# Patient Record
Sex: Male | Born: 1985 | Race: White | Hispanic: No | Marital: Married | Smoking: Never smoker
Health system: Southern US, Community
[De-identification: ages and names within clinical notes are randomized; demographics above are authoritative.]

## PROBLEM LIST (undated history)

## (undated) DIAGNOSIS — K625 Hemorrhage of anus and rectum: Secondary | ICD-10-CM

## (undated) DIAGNOSIS — F32A Depression, unspecified: Secondary | ICD-10-CM

## (undated) DIAGNOSIS — M545 Low back pain, unspecified: Secondary | ICD-10-CM

## (undated) DIAGNOSIS — G4730 Sleep apnea, unspecified: Secondary | ICD-10-CM

## (undated) DIAGNOSIS — M1990 Unspecified osteoarthritis, unspecified site: Secondary | ICD-10-CM

## (undated) DIAGNOSIS — K219 Gastro-esophageal reflux disease without esophagitis: Secondary | ICD-10-CM

## (undated) DIAGNOSIS — E669 Obesity, unspecified: Secondary | ICD-10-CM

## (undated) DIAGNOSIS — R062 Wheezing: Secondary | ICD-10-CM

## (undated) DIAGNOSIS — E7800 Pure hypercholesterolemia, unspecified: Secondary | ICD-10-CM

## (undated) HISTORY — DX: Low back pain, unspecified: M54.50

## (undated) HISTORY — PX: TONSILECTOMY, ADENOIDECTOMY, BILATERAL MYRINGOTOMY AND TUBES: SHX2538

## (undated) HISTORY — DX: Pure hypercholesterolemia, unspecified: E78.00

## (undated) HISTORY — DX: Hemorrhage of anus and rectum: K62.5

## (undated) HISTORY — DX: Wheezing: R06.2

## (undated) HISTORY — PX: OTHER SURGICAL HISTORY: SHX169

## (undated) HISTORY — DX: Depression, unspecified: F32.A

## (undated) HISTORY — DX: Obesity, unspecified: E66.9

## (undated) HISTORY — PX: KNEE SURGERY: SHX244

---
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
## 2010-11-03 HISTORY — PX: KNEE SURGERY: SHX244

## 2011-03-27 ENCOUNTER — Emergency Department: Payer: Self-pay | Admitting: Emergency Medicine

## 2012-03-14 DIAGNOSIS — S83289A Other tear of lateral meniscus, current injury, unspecified knee, initial encounter: Secondary | ICD-10-CM | POA: Insufficient documentation

## 2012-03-25 DIAGNOSIS — Z9889 Other specified postprocedural states: Secondary | ICD-10-CM | POA: Insufficient documentation

## 2012-04-26 DIAGNOSIS — Z9889 Other specified postprocedural states: Secondary | ICD-10-CM | POA: Insufficient documentation

## 2017-07-28 ENCOUNTER — Encounter: Payer: Self-pay | Admitting: Emergency Medicine

## 2017-07-28 ENCOUNTER — Inpatient Hospital Stay
Admission: EM | Admit: 2017-07-28 | Discharge: 2017-07-30 | DRG: 494 | Disposition: A | Discharge status: Home / Self Care | Payer: BLUE CROSS/BLUE SHIELD | Attending: Surgery | Admitting: Surgery

## 2017-07-28 ENCOUNTER — Emergency Department: Payer: BLUE CROSS/BLUE SHIELD

## 2017-07-28 DIAGNOSIS — Z6836 Body mass index (BMI) 36.0-36.9, adult: Secondary | ICD-10-CM

## 2017-07-28 DIAGNOSIS — Y9364 Activity, baseball: Secondary | ICD-10-CM

## 2017-07-28 DIAGNOSIS — S9305XA Dislocation of left ankle joint, initial encounter: Secondary | ICD-10-CM

## 2017-07-28 DIAGNOSIS — S82832A Other fracture of upper and lower end of left fibula, initial encounter for closed fracture: Principal | ICD-10-CM | POA: Diagnosis present

## 2017-07-28 DIAGNOSIS — Z419 Encounter for procedure for purposes other than remedying health state, unspecified: Secondary | ICD-10-CM

## 2017-07-28 DIAGNOSIS — S82899A Other fracture of unspecified lower leg, initial encounter for closed fracture: Secondary | ICD-10-CM | POA: Diagnosis present

## 2017-07-28 LAB — CBC WITH DIFFERENTIAL/PLATELET
BASOS ABS: 0.1 10*3/uL (ref 0–0.1)
Basophils Relative: 1 %
EOS ABS: 0.2 10*3/uL (ref 0–0.7)
EOS PCT: 2 %
HEMATOCRIT: 43.4 % (ref 40.0–52.0)
Hemoglobin: 14.7 g/dL (ref 13.0–18.0)
LYMPHS ABS: 1.2 10*3/uL (ref 1.0–3.6)
Lymphocytes Relative: 9 %
MCH: 28.2 pg (ref 26.0–34.0)
MCHC: 34 g/dL (ref 32.0–36.0)
MCV: 82.9 fL (ref 80.0–100.0)
Monocytes Absolute: 0.9 10*3/uL (ref 0.2–1.0)
Monocytes Relative: 7 %
Neutro Abs: 10.9 10*3/uL — ABNORMAL HIGH (ref 1.4–6.5)
Neutrophils Relative %: 81 %
PLATELETS: 193 10*3/uL (ref 150–440)
RBC: 5.23 MIL/uL (ref 4.40–5.90)
RDW: 13.6 % (ref 11.5–14.5)
WBC: 13.3 10*3/uL — AB (ref 3.8–10.6)

## 2017-07-28 LAB — COMPREHENSIVE METABOLIC PANEL
ALK PHOS: 54 U/L (ref 38–126)
ALT: 48 U/L (ref 17–63)
AST: 26 U/L (ref 15–41)
Albumin: 4.6 g/dL (ref 3.5–5.0)
Anion gap: 11 (ref 5–15)
BUN: 23 mg/dL — AB (ref 6–20)
CO2: 27 mmol/L (ref 22–32)
CREATININE: 1.14 mg/dL (ref 0.61–1.24)
Calcium: 9.4 mg/dL (ref 8.9–10.3)
Chloride: 103 mmol/L (ref 101–111)
GFR calc Af Amer: >60 mL/min (ref >60)
Glucose, Bld: 107 mg/dL — ABNORMAL HIGH (ref 65–99)
Potassium: 4 mmol/L (ref 3.5–5.1)
Sodium: 141 mmol/L (ref 135–145)
TOTAL PROTEIN: 7.4 g/dL (ref 6.5–8.1)
Total Bilirubin: 0.8 mg/dL (ref 0.3–1.2)

## 2017-07-28 MED ORDER — HYDROMORPHONE HCL 1 MG/ML IJ SOLN
INTRAMUSCULAR | Status: AC
  Filled 2017-07-28: qty 1

## 2017-07-28 MED ORDER — KCL IN DEXTROSE-NACL 20-5-0.9 MEQ/L-%-% IV SOLN
INTRAVENOUS | Status: DC
  Administered 2017-07-28 – 2017-07-29 (×2): via INTRAVENOUS
  Filled 2017-07-28 (×5): qty 1000

## 2017-07-28 MED ORDER — DEXTROSE 5 % IV SOLN
3.0000 g | Freq: Once | INTRAVENOUS | Status: DC
  Filled 2017-07-28 (×2): qty 3000

## 2017-07-28 MED ORDER — HYDROMORPHONE HCL 1 MG/ML IJ SOLN
1.0000 mg | Freq: Once | INTRAMUSCULAR | Status: AC
  Administered 2017-07-28: 1 mg via INTRAVENOUS

## 2017-07-28 MED ORDER — INFLUENZA VAC SPLIT QUAD 0.5 ML IM SUSY: 0.5000 mL | PREFILLED_SYRINGE | INTRAMUSCULAR | Status: DC

## 2017-07-28 MED ORDER — ACETAMINOPHEN 650 MG RE SUPP: 650.0000 mg | Freq: Four times a day (QID) | RECTAL | Status: DC | PRN

## 2017-07-28 MED ORDER — ACETAMINOPHEN 325 MG PO TABS: 650.0000 mg | ORAL_TABLET | Freq: Four times a day (QID) | ORAL | Status: DC | PRN

## 2017-07-28 MED ORDER — OXYCODONE HCL 5 MG PO TABS
5.0000 mg | ORAL_TABLET | ORAL | Status: DC | PRN
  Administered 2017-07-28 – 2017-07-29 (×2): 10 mg via ORAL
  Filled 2017-07-28 (×2): qty 2

## 2017-07-28 MED ORDER — MAGNESIUM HYDROXIDE 400 MG/5ML PO SUSP: 30.0000 mL | Freq: Every day | ORAL | Status: DC | PRN

## 2017-07-28 MED ORDER — FENTANYL CITRATE (PF) 100 MCG/2ML IJ SOLN
100.0000 ug | Freq: Once | INTRAMUSCULAR | Status: AC
  Administered 2017-07-28: 100 ug via INTRAVENOUS
  Filled 2017-07-28: qty 2

## 2017-07-28 MED ORDER — FLEET ENEMA 7-19 GM/118ML RE ENEM: 1.0000 | ENEMA | Freq: Once | RECTAL | Status: DC | PRN

## 2017-07-28 MED ORDER — PANTOPRAZOLE SODIUM 40 MG IV SOLR: 40.0000 mg | Freq: Every day | INTRAVENOUS | Status: DC

## 2017-07-28 MED ORDER — DOCUSATE SODIUM 100 MG PO CAPS: 100.0000 mg | ORAL_CAPSULE | Freq: Two times a day (BID) | ORAL | Status: DC

## 2017-07-28 MED ORDER — BISACODYL 10 MG RE SUPP: 10.0000 mg | Freq: Every day | RECTAL | Status: DC | PRN

## 2017-07-28 MED ORDER — ACETAMINOPHEN 325 MG PO TABS
650.0000 mg | ORAL_TABLET | Freq: Once | ORAL | Status: AC
  Administered 2017-07-28: 650 mg via ORAL
  Filled 2017-07-28: qty 2

## 2017-07-28 MED ORDER — ONDANSETRON HCL 4 MG/2ML IJ SOLN
4.0000 mg | Freq: Once | INTRAMUSCULAR | Status: AC
  Administered 2017-07-28: 4 mg via INTRAVENOUS
  Filled 2017-07-28: qty 2

## 2017-07-28 MED ORDER — HYDROMORPHONE HCL 1 MG/ML IJ SOLN
1.0000 mg | INTRAMUSCULAR | Status: DC | PRN
  Administered 2017-07-29 (×3): 1 mg via INTRAVENOUS
  Filled 2017-07-28 (×3): qty 1

## 2017-07-28 NOTE — ED Notes (Signed)
Pt was playing softball when he twisted left ankle sliding into base. Positive swelling noted to left ankle with inability to bear weight. Pt has normal pulses and circ wnl.

## 2017-07-28 NOTE — ED Triage Notes (Addendum)
Pt assisted out of vehicle onto stretcher with no distress noted; reports PTA injured left ankle while playing softball, sliding into base; denies any other c/o or injuries at present; st unable to weight bear; +PP, brisk cap refill, W&D, good movement & sensation

## 2017-07-28 NOTE — ED Notes (Signed)
Ortho in to eval

## 2017-07-28 NOTE — ED Provider Notes (Signed)
[facility] Emergency Department Provider Note  ____________________________________________  Time seen: Approximately 8:37 PM  I have reviewed the triage vital signs and the nursing notes.   HISTORY  Chief Complaint Ankle Pain    HPI Adrian Lang is a 31 y.o. male who presents emergency department complaining of sharp left ankle pain. Patient reports it is planned softball when he injured his ankle sliding into base. Patient reports that his left ankle was tucked underneath him and he arises that his toe became caught in the dirt, rotating his ankle laterally. Patient heard and felt an audible snap. Patient had extreme immediate pain and is nonweightbearing. Limited range of motion to the ankle joint. No numbness and tingling in the toe. Patient denies any other injury or complaint. No history of previous ankle fractures.   History reviewed. No pertinent past medical history.  Patient Active Problem List   Diagnosis Date Noted  . Ankle fracture 07/28/2017    Past Surgical History:  Procedure Laterality Date  . KNEE SURGERY Right     Prior to Admission medications   Not on File    Allergies Patient has no known allergies.  No family history on file.  Social History Social History  Substance Use Topics  . Smoking status: Never Smoker  . Smokeless tobacco: Never Used  . Alcohol use No     Review of Systems  Constitutional: No fever/chills Eyes: No visual changes. No discharge ENT: No upper respiratory complaints. Cardiovascular: no chest pain. Respiratory: no cough. No SOB. Gastrointestinal: No abdominal pain.  No nausea, no vomiting.   Musculoskeletal: Positive for pain, swelling, injury to the left ankle. Skin: Negative for rash, abrasions, lacerations, ecchymosis. Neurological: Negative for headaches, focal weakness or numbness. 10-point ROS otherwise negative.  ____________________________________________   PHYSICAL  EXAM:  VITAL SIGNS: ED Triage Vitals  Enc Vitals Group     BP 07/28/17 1951 (!) 147/87     Pulse Rate 07/28/17 1951 88     Resp 07/28/17 1951 18     Temp 07/28/17 1951 97.7 F (36.5 C)     Temp Source 07/28/17 1951 Oral     SpO2 07/28/17 1951 97 %     Weight 07/28/17 1950 252 lb (114.3 kg)     Height 07/28/17 1950 6' (1.829 m)     Head Circumference --      Peak Flow --      Pain Score 07/28/17 1949 10     Pain Loc --      Pain Edu? --      Excl. in GC? --      Constitutional: Alert and oriented. Well appearing and in no acute distress. Eyes: Conjunctivae are normal. PERRL. EOMI. Head: Atraumatic. Neck: No stridor.    Cardiovascular: Normal rate, regular rhythm. Normal S1 and S2.  Good peripheral circulation. Respiratory: Normal respiratory effort without tachypnea or retractions. Lungs CTAB. Good air entry to the bases with no decreased or absent breath sounds. Musculoskeletal: No gross deformity to left ankle upon inspection. Edema noted both medial and lateral aspects of the ankle. Limited range of motion. No stress testing of the ankle is performed at this time. Dorsalis pedis pulse intact. Sensation intact all 5 digits. Patient has tenderness to palpation over the distal fibula as well as medial malleolus. No definitive palpable abnormality.. Neurologic:  Normal speech and language. No gross focal neurologic deficits are appreciated.  Skin:  Skin is warm, dry and intact. No rash noted. Psychiatric: Mood and  affect are normal. Speech and behavior are normal. Patient exhibits appropriate insight and judgement.   ____________________________________________   LABS (all labs ordered are listed, but only abnormal results are displayed)  Labs Reviewed  COMPREHENSIVE METABOLIC PANEL - Abnormal; Notable for the following:       Result Value   Glucose, Bld 107 (*)    BUN 23 (*)    All other components within normal limits  CBC WITH DIFFERENTIAL/PLATELET - Abnormal; Notable  for the following:    WBC 13.3 (*)    Neutro Abs 10.9 (*)    All other components within normal limits   ____________________________________________  EKG   ____________________________________________  RADIOLOGY Festus Barren Rual Vermeer, personally viewed and evaluated these images (plain radiographs) as part of my medical decision making, as well as reviewing the written report by the radiologist.  Dg Chest 1 View  Result Date: 07/28/2017 CLINICAL DATA:  Preoperative chest x-ray. EXAM: CHEST 1 VIEW COMPARISON:  None. FINDINGS: The heart size and mediastinal contours are within normal limits. Both lungs are clear. The visualized skeletal structures are unremarkable. IMPRESSION: No active cardiopulmonary disease. Electronically Signed   By: Sherian Rein M.D.   On: 07/28/2017 21:31   Dg Ankle Complete Left  Result Date: 07/28/2017 CLINICAL DATA:  Status post trauma today with pain of left ankle. EXAM: LEFT ANKLE COMPLETE - 3+ VIEW COMPARISON:  None. FINDINGS: There is comminuted displaced fracture of the distal fibular shaft. There is disruption of the ankle mortise with medial displacement of the tibia relative to the talus. IMPRESSION: Fracture of distal fibula. Disruption of ankle mortise as described. Electronically Signed   By: Sherian Rein M.D.   On: 07/28/2017 20:18    ____________________________________________    PROCEDURES  Procedure(s) performed:    .Splint Application Date/Time: 07/28/2017 9:27 PM Performed by: Gala Romney D Authorized by: Gala Romney D   Consent:    Consent obtained:  Verbal   Consent given by:  Patient   Risks discussed:  Pain and swelling Pre-procedure details:    Sensation:  Normal Procedure details:    Laterality:  Left   Location:  Ankle   Ankle:  L ankle   Splint type:  Short leg and ankle stirrup   Supplies:  Cotton padding, elastic bandage and Ortho-Glass Post-procedure details:    Pain:  Improved   Sensation:   Normal   Patient tolerance of procedure:  Tolerated well, no immediate complications      Medications  dextrose 5 % and 0.9 % NaCl with KCl 20 mEq/L infusion (not administered)  acetaminophen (TYLENOL) tablet 650 mg (not administered)    Or  acetaminophen (TYLENOL) suppository 650 mg (not administered)  HYDROmorphone (DILAUDID) injection 1-2 mg (not administered)  docusate sodium (COLACE) capsule 100 mg (not administered)  magnesium hydroxide (MILK OF MAGNESIA) suspension 30 mL (not administered)  bisacodyl (DULCOLAX) suppository 10 mg (not administered)  sodium phosphate (FLEET) 7-19 GM/118ML enema 1 enema (not administered)  pantoprazole (PROTONIX) injection 40 mg (not administered)  ceFAZolin (ANCEF) 3 g in dextrose 5 % 50 mL IVPB (not administered)  oxyCODONE (Oxy IR/ROXICODONE) immediate release tablet 5-10 mg (not administered)  HYDROmorphone (DILAUDID) 1 MG/ML injection (not administered)  Influenza vac split quadrivalent PF (FLUARIX) injection 0.5 mL (not administered)  acetaminophen (TYLENOL) tablet 650 mg (650 mg Oral Given 07/28/17 2055)  ondansetron (ZOFRAN) injection 4 mg (4 mg Intravenous Given 07/28/17 2114)  fentaNYL (SUBLIMAZE) injection 100 mcg (100 mcg Intravenous Given 07/28/17 2116)  HYDROmorphone (DILAUDID)  injection 1 mg (1 mg Intravenous Given 07/28/17 2155)     ____________________________________________   INITIAL IMPRESSION / ASSESSMENT AND PLAN / ED COURSE  Pertinent labs & imaging results that were available during my care of the patient were reviewed by me and considered in my medical decision making (see chart for details).  Review of the [place] CSRS was performed in accordance of the [facility] prior to dispensing any controlled drugs.     Patient's diagnosis is consistent with left distal fibula fracture with separation of the ankle mortise consistent with ligamentous rupture of the ankle. Patient was neurovascularly intact. Initially patient declined pain  medication. I discussed the case with on-call orthopedic surgeon, Dr. Joice Lofts who recommends admission overnight for pain control, immobilization, surgical fixation in the morning..      ____________________________________________  FINAL CLINICAL IMPRESSION(S) / ED DIAGNOSES  Final diagnoses:  Other closed fracture of distal end of left fibula, initial encounter  Dislocation of left ankle joint, initial encounter      NEW MEDICATIONS STARTED DURING THIS VISIT:  There are no discharge medications for this patient.       This chart was dictated using voice recognition software/Dragon. Despite best efforts to proofread, errors can occur which can change the meaning. Any change was purely unintentional.    Racheal Patches, PA-C 07/28/17 2300    Dionne Bucy, MD 07/29/17 0020

## 2017-07-28 NOTE — ED Notes (Signed)
Report given to [facility]. Patient in stable condition prior. Patient care transferred to 138

## 2017-07-29 ENCOUNTER — Inpatient Hospital Stay: Payer: BLUE CROSS/BLUE SHIELD

## 2017-07-29 ENCOUNTER — Encounter: Payer: Self-pay | Admitting: *Deleted

## 2017-07-29 ENCOUNTER — Encounter
Admission: EM | Disposition: A | Discharge status: Home / Self Care | Payer: Self-pay | Source: Home / Self Care | Attending: Surgery

## 2017-07-29 ENCOUNTER — Inpatient Hospital Stay: Payer: BLUE CROSS/BLUE SHIELD | Admitting: Anesthesiology

## 2017-07-29 HISTORY — PX: ORIF ANKLE FRACTURE: SHX5408

## 2017-07-29 LAB — SURGICAL PCR SCREEN
MRSA, PCR: NEGATIVE
Staphylococcus aureus: POSITIVE — AB

## 2017-07-29 SURGERY — OPEN REDUCTION INTERNAL FIXATION (ORIF) ANKLE FRACTURE
Anesthesia: General | Site: Ankle | Laterality: Left | Wound class: Clean

## 2017-07-29 MED ORDER — GLYCOPYRROLATE 0.2 MG/ML IJ SOLN
INTRAMUSCULAR | Status: DC | PRN
Start: 2017-07-29 — End: 2017-07-29
  Administered 2017-07-29: 0.2 mg via INTRAVENOUS

## 2017-07-29 MED ORDER — FENTANYL CITRATE (PF) 100 MCG/2ML IJ SOLN
INTRAMUSCULAR | Status: AC
  Administered 2017-07-29: 25 ug via INTRAVENOUS
  Filled 2017-07-29: qty 2

## 2017-07-29 MED ORDER — KETOROLAC TROMETHAMINE 15 MG/ML IJ SOLN
30.0000 mg | Freq: Once | INTRAMUSCULAR | Status: DC
  Filled 2017-07-29: qty 2

## 2017-07-29 MED ORDER — METOCLOPRAMIDE HCL 10 MG PO TABS: 5.0000 mg | ORAL_TABLET | Freq: Three times a day (TID) | ORAL | Status: DC | PRN

## 2017-07-29 MED ORDER — PROPOFOL 10 MG/ML IV BOLUS
INTRAVENOUS | Status: AC
  Filled 2017-07-29: qty 20

## 2017-07-29 MED ORDER — MIDAZOLAM HCL 2 MG/2ML IJ SOLN
INTRAMUSCULAR | Status: DC | PRN
  Administered 2017-07-29: 2 mg via INTRAVENOUS

## 2017-07-29 MED ORDER — ACETAMINOPHEN 10 MG/ML IV SOLN
INTRAVENOUS | Status: AC
  Filled 2017-07-29: qty 100

## 2017-07-29 MED ORDER — LACTATED RINGERS IV SOLN
INTRAVENOUS | Status: DC | PRN
  Administered 2017-07-29: 14:00:00 via INTRAVENOUS

## 2017-07-29 MED ORDER — HYDROMORPHONE HCL 1 MG/ML IJ SOLN
0.5000 mg | INTRAMUSCULAR | Status: DC | PRN
  Administered 2017-07-29 (×2): 0.5 mg via INTRAVENOUS

## 2017-07-29 MED ORDER — OXYCODONE HCL 5 MG PO TABS
5.0000 mg | ORAL_TABLET | ORAL | Status: DC | PRN
  Administered 2017-07-29 – 2017-07-30 (×2): 10 mg via ORAL
  Filled 2017-07-29 (×2): qty 2

## 2017-07-29 MED ORDER — MIDAZOLAM HCL 2 MG/2ML IJ SOLN
INTRAMUSCULAR | Status: AC
  Filled 2017-07-29: qty 2

## 2017-07-29 MED ORDER — HYDROMORPHONE HCL 1 MG/ML IJ SOLN
INTRAMUSCULAR | Status: AC
  Administered 2017-07-29: 0.5 mg via INTRAVENOUS
  Filled 2017-07-29: qty 1

## 2017-07-29 MED ORDER — KETOROLAC TROMETHAMINE 30 MG/ML IJ SOLN
INTRAMUSCULAR | Status: AC
  Administered 2017-07-29: 30 mg via INTRAVENOUS
  Filled 2017-07-29: qty 1

## 2017-07-29 MED ORDER — FENTANYL CITRATE (PF) 100 MCG/2ML IJ SOLN
25.0000 ug | INTRAMUSCULAR | Status: DC | PRN
  Administered 2017-07-29 (×4): 25 ug via INTRAVENOUS

## 2017-07-29 MED ORDER — FLEET ENEMA 7-19 GM/118ML RE ENEM: 1.0000 | ENEMA | Freq: Once | RECTAL | Status: DC | PRN

## 2017-07-29 MED ORDER — ENOXAPARIN SODIUM 40 MG/0.4ML ~~LOC~~ SOLN: 40.0000 mg | SUBCUTANEOUS | Status: DC

## 2017-07-29 MED ORDER — LIDOCAINE HCL (CARDIAC) 20 MG/ML IV SOLN
INTRAVENOUS | Status: DC | PRN
  Administered 2017-07-29: 100 mg via INTRAVENOUS

## 2017-07-29 MED ORDER — BISACODYL 10 MG RE SUPP: 10.0000 mg | Freq: Every day | RECTAL | Status: DC | PRN

## 2017-07-29 MED ORDER — METOCLOPRAMIDE HCL 5 MG/ML IJ SOLN: 5.0000 mg | Freq: Three times a day (TID) | INTRAMUSCULAR | Status: DC | PRN

## 2017-07-29 MED ORDER — DEXAMETHASONE SODIUM PHOSPHATE 10 MG/ML IJ SOLN
INTRAMUSCULAR | Status: DC | PRN
  Administered 2017-07-29: 10 mg via INTRAVENOUS

## 2017-07-29 MED ORDER — ONDANSETRON HCL 4 MG PO TABS: 4.0000 mg | ORAL_TABLET | Freq: Four times a day (QID) | ORAL | Status: DC | PRN

## 2017-07-29 MED ORDER — ONDANSETRON HCL 4 MG/2ML IJ SOLN
INTRAMUSCULAR | Status: AC
  Filled 2017-07-29: qty 2

## 2017-07-29 MED ORDER — KETOROLAC TROMETHAMINE 15 MG/ML IJ SOLN
15.0000 mg | Freq: Four times a day (QID) | INTRAMUSCULAR | Status: DC
  Administered 2017-07-29 – 2017-07-30 (×3): 15 mg via INTRAVENOUS
  Filled 2017-07-29 (×3): qty 1

## 2017-07-29 MED ORDER — ACETAMINOPHEN 325 MG PO TABS: 650.0000 mg | ORAL_TABLET | Freq: Four times a day (QID) | ORAL | Status: DC | PRN

## 2017-07-29 MED ORDER — FENTANYL CITRATE (PF) 100 MCG/2ML IJ SOLN
INTRAMUSCULAR | Status: AC
  Filled 2017-07-29: qty 4

## 2017-07-29 MED ORDER — BUPIVACAINE HCL (PF) 0.5 % IJ SOLN
INTRAMUSCULAR | Status: AC
  Filled 2017-07-29: qty 30

## 2017-07-29 MED ORDER — ONDANSETRON HCL 4 MG/2ML IJ SOLN
4.0000 mg | Freq: Four times a day (QID) | INTRAMUSCULAR | Status: DC | PRN
  Administered 2017-07-29: 4 mg via INTRAVENOUS
  Filled 2017-07-29: qty 2

## 2017-07-29 MED ORDER — ACETAMINOPHEN 650 MG RE SUPP: 650.0000 mg | Freq: Four times a day (QID) | RECTAL | Status: DC | PRN

## 2017-07-29 MED ORDER — ONDANSETRON HCL 4 MG/2ML IJ SOLN: 4.0000 mg | Freq: Four times a day (QID) | INTRAMUSCULAR | Status: DC | PRN

## 2017-07-29 MED ORDER — DEXAMETHASONE SODIUM PHOSPHATE 10 MG/ML IJ SOLN
INTRAMUSCULAR | Status: AC
  Filled 2017-07-29: qty 1

## 2017-07-29 MED ORDER — DEXTROSE 5 % IV SOLN
3.0000 g | Freq: Four times a day (QID) | INTRAVENOUS | Status: AC
  Administered 2017-07-29 – 2017-07-30 (×3): 3 g via INTRAVENOUS
  Filled 2017-07-29 (×3): qty 3000

## 2017-07-29 MED ORDER — MAGNESIUM HYDROXIDE 400 MG/5ML PO SUSP: 30.0000 mL | Freq: Every day | ORAL | Status: DC | PRN

## 2017-07-29 MED ORDER — GLYCOPYRROLATE 0.2 MG/ML IJ SOLN
INTRAMUSCULAR | Status: AC
  Filled 2017-07-29: qty 1

## 2017-07-29 MED ORDER — KETOROLAC TROMETHAMINE 30 MG/ML IJ SOLN
30.0000 mg | Freq: Once | INTRAMUSCULAR | Status: AC
  Administered 2017-07-29: 30 mg via INTRAVENOUS

## 2017-07-29 MED ORDER — DOCUSATE SODIUM 100 MG PO CAPS
100.0000 mg | ORAL_CAPSULE | Freq: Two times a day (BID) | ORAL | Status: DC
  Administered 2017-07-29 – 2017-07-30 (×2): 100 mg via ORAL
  Filled 2017-07-29 (×2): qty 1

## 2017-07-29 MED ORDER — PANTOPRAZOLE SODIUM 40 MG PO TBEC
40.0000 mg | DELAYED_RELEASE_TABLET | Freq: Every day | ORAL | Status: DC
  Administered 2017-07-29 – 2017-07-30 (×2): 40 mg via ORAL
  Filled 2017-07-29 (×2): qty 1

## 2017-07-29 MED ORDER — DEXTROSE 5 % IV SOLN
INTRAVENOUS | Status: DC | PRN
  Administered 2017-07-29: 3 g via INTRAVENOUS

## 2017-07-29 MED ORDER — SEVOFLURANE IN SOLN
RESPIRATORY_TRACT | Status: AC
  Filled 2017-07-29: qty 250

## 2017-07-29 MED ORDER — ACETAMINOPHEN 500 MG PO TABS
1000.0000 mg | ORAL_TABLET | Freq: Four times a day (QID) | ORAL | Status: DC
  Administered 2017-07-29 – 2017-07-30 (×3): 1000 mg via ORAL
  Filled 2017-07-29 (×3): qty 2

## 2017-07-29 MED ORDER — ONDANSETRON HCL 4 MG/2ML IJ SOLN
INTRAMUSCULAR | Status: DC | PRN
  Administered 2017-07-29: 4 mg via INTRAVENOUS

## 2017-07-29 MED ORDER — NEOMYCIN-POLYMYXIN B GU 40-200000 IR SOLN
Status: DC | PRN
  Administered 2017-07-29: 4 mL

## 2017-07-29 MED ORDER — PROPOFOL 10 MG/ML IV BOLUS
INTRAVENOUS | Status: DC | PRN
  Administered 2017-07-29: 200 mg via INTRAVENOUS

## 2017-07-29 MED ORDER — DIPHENHYDRAMINE HCL 12.5 MG/5ML PO ELIX: 12.5000 mg | ORAL_SOLUTION | ORAL | Status: DC | PRN

## 2017-07-29 MED ORDER — NEOMYCIN-POLYMYXIN B GU 40-200000 IR SOLN
Status: AC
  Filled 2017-07-29: qty 4

## 2017-07-29 MED ORDER — LIDOCAINE HCL (PF) 2 % IJ SOLN
INTRAMUSCULAR | Status: AC
  Filled 2017-07-29: qty 6

## 2017-07-29 MED ORDER — BUPIVACAINE HCL 0.5 % IJ SOLN
INTRAMUSCULAR | Status: DC | PRN
  Administered 2017-07-29: 15 mL

## 2017-07-29 MED ORDER — ACETAMINOPHEN 10 MG/ML IV SOLN
INTRAVENOUS | Status: DC | PRN
  Administered 2017-07-29: 1000 mg via INTRAVENOUS

## 2017-07-29 MED ORDER — ONDANSETRON HCL 4 MG/2ML IJ SOLN: 4.0000 mg | Freq: Once | INTRAMUSCULAR | Status: DC | PRN

## 2017-07-29 MED ORDER — FENTANYL CITRATE (PF) 100 MCG/2ML IJ SOLN
INTRAMUSCULAR | Status: DC | PRN
  Administered 2017-07-29 (×2): 50 ug via INTRAVENOUS
  Administered 2017-07-29: 100 ug via INTRAVENOUS

## 2017-07-29 MED ORDER — KCL IN DEXTROSE-NACL 20-5-0.9 MEQ/L-%-% IV SOLN
INTRAVENOUS | Status: DC
  Administered 2017-07-29 – 2017-07-30 (×2): via INTRAVENOUS
  Filled 2017-07-29 (×3): qty 1000

## 2017-07-29 SURGICAL SUPPLY — 52 items
BANDAGE ACE 4X5 VEL STRL LF (GAUZE/BANDAGES/DRESSINGS) ×6 IMPLANT
BANDAGE ACE 6X5 VEL STRL LF (GAUZE/BANDAGES/DRESSINGS) ×3 IMPLANT
BIT DRILL 2.5X2.75 QC CALB (BIT) ×3 IMPLANT
BIT DRILL 3.5X5.5 QC CALB (BIT) ×3 IMPLANT
BLADE SURG SZ10 CARB STEEL (BLADE) ×6 IMPLANT
BNDG COHESIVE 4X5 TAN STRL (GAUZE/BANDAGES/DRESSINGS) ×3 IMPLANT
BNDG ESMARK 6X12 TAN STRL LF (GAUZE/BANDAGES/DRESSINGS) ×3 IMPLANT
BNDG PLASTER FAST 4X5 WHT LF (CAST SUPPLIES) ×12 IMPLANT
CANISTER SUCT 1200ML W/VALVE (MISCELLANEOUS) ×3 IMPLANT
CHLORAPREP W/TINT 26ML (MISCELLANEOUS) ×6 IMPLANT
CUFF TOURN 24 STER (MISCELLANEOUS) IMPLANT
CUFF TOURN 30 STER DUAL PORT (MISCELLANEOUS) IMPLANT
DRAPE C-ARM XRAY 36X54 (DRAPES) ×3 IMPLANT
DRAPE C-ARMOR (DRAPES) ×3 IMPLANT
DRAPE INCISE IOBAN 66X45 STRL (DRAPES) ×3 IMPLANT
DRAPE U-SHAPE 47X51 STRL (DRAPES) ×3 IMPLANT
ELECT CAUTERY BLADE 6.4 (BLADE) ×3 IMPLANT
ELECT REM PT RETURN 9FT ADLT (ELECTROSURGICAL) ×3
ELECTRODE REM PT RTRN 9FT ADLT (ELECTROSURGICAL) ×1 IMPLANT
FIXATION ZIPTIGHT ANKLE SNDSMS (Ankle) ×1 IMPLANT
GAUZE PETRO XEROFOAM 1X8 (MISCELLANEOUS) ×3 IMPLANT
GAUZE SPONGE 4X4 12PLY STRL (GAUZE/BANDAGES/DRESSINGS) ×3 IMPLANT
GLOVE BIO SURGEON STRL SZ8 (GLOVE) ×6 IMPLANT
GLOVE INDICATOR 8.0 STRL GRN (GLOVE) ×3 IMPLANT
GOWN STRL REUS W/ TWL LRG LVL3 (GOWN DISPOSABLE) ×1 IMPLANT
GOWN STRL REUS W/ TWL XL LVL3 (GOWN DISPOSABLE) ×1 IMPLANT
GOWN STRL REUS W/TWL LRG LVL3 (GOWN DISPOSABLE) ×2
GOWN STRL REUS W/TWL XL LVL3 (GOWN DISPOSABLE) ×2
HEMOVAC 400ML (MISCELLANEOUS) ×3
KIT DRAIN HEMOVAC JP 7FR 400ML (MISCELLANEOUS) ×1 IMPLANT
KIT RM TURNOVER STRD PROC AR (KITS) ×3 IMPLANT
LABEL OR SOLS (LABEL) ×3 IMPLANT
NS IRRIG 1000ML POUR BTL (IV SOLUTION) ×3 IMPLANT
PACK EXTREMITY ARMC (MISCELLANEOUS) ×3 IMPLANT
PAD ABD DERMACEA PRESS 5X9 (GAUZE/BANDAGES/DRESSINGS) ×6 IMPLANT
PAD CAST CTTN 4X4 STRL (SOFTGOODS) ×2 IMPLANT
PAD PREP 24X41 OB/GYN DISP (PERSONAL CARE ITEMS) ×3 IMPLANT
PADDING CAST COTTON 4X4 STRL (SOFTGOODS) ×4
PLATE LOCK 8H 103 BILAT FIB (Plate) ×3 IMPLANT
SCREW CORTICAL 3.5MM 22MM (Screw) ×3 IMPLANT
SCREW LOCK CORT STAR 3.5X14 (Screw) ×3 IMPLANT
SCREW LOCK CORT STAR 3.5X18 (Screw) ×6 IMPLANT
SCREW NON LOCKING LP 3.5 14MM (Screw) ×3 IMPLANT
SCREW NON LOCKING LP 3.5 16MM (Screw) ×6 IMPLANT
SPONGE LAP 18X18 5 PK (GAUZE/BANDAGES/DRESSINGS) ×3 IMPLANT
STAPLER SKIN PROX 35W (STAPLE) ×3 IMPLANT
STOCKINETTE IMPERV 14X48 (MISCELLANEOUS) ×3 IMPLANT
SUT VIC AB 0 CT1 36 (SUTURE) ×3 IMPLANT
SUT VIC AB 2-0 SH 27 (SUTURE) ×4
SUT VIC AB 2-0 SH 27XBRD (SUTURE) ×2 IMPLANT
SYRINGE 10CC LL (SYRINGE) ×3 IMPLANT
ZIPTIGHT ANKLE SYNODESMOSS FIX (Ankle) ×3 IMPLANT

## 2017-07-29 NOTE — Care Management Note (Addendum)
Case Management Note  Patient Details  Name: Adrian Lang MRN: [id number] Date of Birth: 11/29/1985  Subjective/Objective:                  Met with patient prior to surgery for ankle fracture to discuss discharge planning. He is planning to return home at discharge with his wife. He is baseline independent at home; was playing ball when injury occurred. He has no DME. He uses [facility] on [address] for Rx.  Action/Plan: Home health list provided. RNCM will follow for DME.   Expected Discharge Date:  07/30/17               Expected Discharge Plan:     In-House Referral:     Discharge planning Services  CM Consult  Post Acute Care Choice:  Durable Medical Equipment, Home Health Choice offered to:  Patient  DME Arranged:    DME Agency:     HH Arranged:    HH Agency:     Status of Service:  In process, will continue to follow  If discussed at Long Length of Stay Meetings, dates discussed:    Additional Comments:  Angela Johnson, RN 07/29/2017, 10:19 AM  

## 2017-07-29 NOTE — Progress Notes (Signed)
Patient updated on plan and given PRN nausea medication. States that he was able to urinate a small amount, approx and is not having any pain at this time. Patient would like to hold off on I and O cath at this time. Will continue to monitor

## 2017-07-29 NOTE — Progress Notes (Signed)
Spoke with Dr. Ernest Pine, regarding patient status- nausea and difficulty urinating.   Med order given for I and O Cath PRN time one and for nausea medication.   Per MD, check with patient prior to I and O and cath if patient symptomatic.

## 2017-07-29 NOTE — Progress Notes (Signed)
Checked with patient, denies any bladder/pelvic pain and refusing I and O cath at this time. States he will wait until surgery. Pre-op called to check on wait time and PRN pain medication administered for comfort.

## 2017-07-29 NOTE — Anesthesia Postprocedure Evaluation (Signed)
Anesthesia Post Note  Patient: Adrian Lang  Procedure(s) Performed: Procedure(s) (LRB): OPEN REDUCTION INTERNAL FIXATION (ORIF) ANKLE FRACTURE (Left)  Patient location during evaluation: PACU Anesthesia Type: General Level of consciousness: awake and alert and oriented Pain management: pain level controlled Vital Signs Assessment: post-procedure vital signs reviewed and stable Respiratory status: spontaneous breathing, nonlabored ventilation and respiratory function stable Cardiovascular status: blood pressure returned to baseline and stable Postop Assessment: no signs of nausea or vomiting Anesthetic complications: no     Last Vitals:  Vitals:   07/29/17 1705 07/29/17 1710  BP:    Pulse: 87 97  Resp: 15 12  Temp:    SpO2: 96% 92%    Last Pain:  Vitals:   07/29/17 1710  TempSrc:   PainSc: 8                  Vikas Wegmann

## 2017-07-29 NOTE — Op Note (Signed)
07/28/2017 - 07/29/2017  4:30 PM  Patient:   Adrian Lang  Pre-Op Diagnosis:   Closed unstable SER IV distal fibular fracture, left ankle.  Post-Op Diagnosis:   Same.  Procedure:   Open reduction and internal fixation of unstable distal fibular fracture, left ankle.  Surgeon:   Maryagnes Amos, MD  Assistant:   None  Anesthesia:   General LMA  Findings:   As above.  Complications:   None  EBL:   5 cc  Fluids:   800 cc crystalloid  UOP:   None  TT:   60 min at 250 mmHg  Drains:   None  Closure:   Staples  Implants:   Biomet ALPS 8-hole composite locking plate and screws  Brief Clinical Note:   The patient is a 31 year old male who sustained the above-noted injury yesterday evening while sliding awkwardly into second base during a softball game. He presented to the emergency room where x-rays demonstrated the above-noted injury. The patient was splinted and presents at this time for definitive management of his/her injury.  Procedure:   The patient was brought into the operating room. After adequate general laryngeal mask anesthesia was obtained, the patient was lain in the supine position. The left foot and lower leg were prepped with ChloraPrep solution, then draped sterilely. Preoperative antibiotics were administered. A timeout was performed to verify the appropriate surgical site before the limb was exsanguinated with an Esmarch and the calf tourniquet inflated to 250 mmHg. Laterally, a 10-12 cm incision was made over the lateral aspect of the distal fibula. The incision was carried down through the subcutaneous tissues to expose the fracture site. The fracture hematoma was debrided before the fracture was reduced and temporarily secured using a bone clamp. A lag screw was placed in an anterior to posterior direction perpendicular to the fracture. An 8-hole composite locking plate was applied over the lateral aspect of the distal fibula. After verifying its position  fluoroscopically, it was secured using a 3.5 mm nonlocking cortical screw proximal to the fracture. Again the plate's position was adjusted slightly based on AP and lateral projections before it was secured using two additional bicortical screws proximally and three locking screws distally. The adequacy of fracture reduction and hardware position was verified fluoroscopically in AP and lateral projections and found to be excellent.   Through one of the holes distal to the fracture, a Biomet Tight Rope device was passed across the syndesmosis and into the distal fibula parallel to the mortise. The position of the guidewire was verified fluoroscopically and found to be excellent. The guidewire was overreamed with the appropriate drill before the Tight Rope device was inserted and tightened securely, thereby stabilizing the mortise. Again the adequacy of fracture reduction, hardware position, and mortise restoration was verified in AP, lateral, and oblique projections and found to be excellent.  The wound was copiously irrigated with sterile saline solution. The subcutaneous tissues were closed in two layers using 2-0 Vicryl interrupted sutures before the skin was closed using staples. A total of 15 cc of 0.5% plain Sensorcaine was injected in and around the incision site to help with postoperative analgesia. A sterile bulky dressing was applied to the wound before the patient was placed into a posterior splint with a sugar tong supplement, maintaining the ankle in neutral dorsiflexion. The patient was then awakened, extubated, and returned to the recovery room in satisfactory condition after tolerating the procedure well.

## 2017-07-29 NOTE — Anesthesia Preprocedure Evaluation (Signed)
Anesthesia Evaluation  Patient identified by MRN, date of birth, ID band Patient awake    Reviewed: Allergy & Precautions  Airway Mallampati: I       Dental  (+) Teeth Intact   Pulmonary neg pulmonary ROS,    breath sounds clear to auscultation       Cardiovascular Exercise Tolerance: Good  Rhythm:Regular     Neuro/Psych negative neurological ROS  negative psych ROS   GI/Hepatic negative GI ROS, Neg liver ROS,   Endo/Other  negative endocrine ROS  Renal/GU negative Renal ROS  negative genitourinary   Musculoskeletal negative musculoskeletal ROS (+)   Abdominal Normal abdominal exam  (+)   Peds negative pediatric ROS (+)  Hematology negative hematology ROS (+)   Anesthesia Other Findings   Reproductive/Obstetrics                             Anesthesia Physical Anesthesia Plan  ASA: I  Anesthesia Plan: General   Post-op Pain Management:    Induction: Intravenous  PONV Risk Score and Plan: 1 and Ondansetron and Dexamethasone  Airway Management Planned: LMA  Additional Equipment:   Intra-op Plan:   Post-operative Plan: Extubation in OR  Informed Consent: I have reviewed the patients History and Physical, chart, labs and discussed the procedure including the risks, benefits and alternatives for the proposed anesthesia with the patient or authorized representative who has indicated his/her understanding and acceptance.     Plan Discussed with: CRNA  Anesthesia Plan Comments:         Anesthesia Quick Evaluation

## 2017-07-29 NOTE — Progress Notes (Signed)
Patient taken to Pre-op via bed with transporter

## 2017-07-29 NOTE — Anesthesia Procedure Notes (Signed)
Procedure Name: LMA Insertion Date/Time: 07/29/2017 2:40 PM Performed by: Stormy Fabian Pre-anesthesia Checklist: Patient identified, Patient being monitored, Timeout performed, Emergency Drugs available and Suction available Patient Re-evaluated:Patient Re-evaluated prior to induction Oxygen Delivery Method: Circle system utilized Preoxygenation: Pre-oxygenation with 100% oxygen Induction Type: IV induction Ventilation: Mask ventilation without difficulty LMA: LMA inserted LMA Size: 4.5 Tube type: Oral Number of attempts: 1 Placement Confirmation: positive ETCO2 and breath sounds checked- equal and bilateral Tube secured with: Tape Dental Injury: Teeth and Oropharynx as per pre-operative assessment

## 2017-07-29 NOTE — H&P (Signed)
Subjective:  Chief complaint:  Left ankle pain.  The patient is a 31 y.o. male who sustained an injury to the left ankle last evening while sliding into second base during a softball game. He was brought to the emergency room where x-rays demonstrated an unstable fracture of the distal fibula with widening of the mortise medially. The patient was splinted and admitted in preparation for definitive management of this injury.  The patient denies any associated injury or loss of consciousness associated with the injury, and denies any light-headedness, loss of consciousness, chest pain, or shortness of breath which might have contributed to the injury. The patient denies any numbness or paresthesias to his left foot, and denies any prior problems with his left ankle.  Patient Active Problem List   Diagnosis Date Noted  . Ankle fracture 07/28/2017   History reviewed. No pertinent past medical history.  Past Surgical History:  Procedure Laterality Date  . KNEE SURGERY Right     No prescriptions prior to admission.   No Known Allergies  Social History  Substance Use Topics  . Smoking status: Never Smoker  . Smokeless tobacco: Never Used  . Alcohol use No    No family history on file.   Review of Systems: As noted above. The patient denies any chest pain, shortness of breath, nausea, vomiting, diarrhea, constipation, belly pain, blood in his/her stool, or burning with urination.  Objective: Temp:  [97.7 F (36.5 C)-98.7 F (37.1 C)] 98.1 F (36.7 C) (09/26 0857) Pulse Rate:  [65-91] 65 (09/26 0857) Resp:  [18-20] 18 (09/26 0857) BP: (122-147)/(67-90) 122/76 (09/26 0857) SpO2:  [92 %-97 %] 97 % (09/26 0857) Weight:  [114.3 kg (252 lb)-117.8 kg (259 lb 11.2 oz)] 117.8 kg (259 lb 11.2 oz) (09/25 2214)  Physical Exam: General:  Alert, no acute distress Psychiatric:  Patient is competent for consent with normal mood and affect Cardiovascular:  RRR  Respiratory:  Clear to auscultation.  No wheezing. Non-labored breathing GI:  Abdomen is soft and non-tender Skin:  No lesions in the area of chief complaint Neurologic:  Sensation intact distally Lymphatic:  No axillary or cervical lymphadenopathy  Orthopedic Exam:  Orthopedic examination is limited to the left foot and lower leg. Skin inspection is notable for mild to moderate swelling around the medial and lateral aspects of the ankle, but there are no skin violations, abrasions, lacerations, or other abnormalities noted. He has moderate tenderness to palpation over the medial and lateral aspects of the ankle. He has pain with attempted active or passive range of motion of the ankle. He is able to dorsiflex and plantarflex his toes. He is neurovascularly intact to his left foot.  Imaging Review: Recent x-rays of the left ankle are available for review.  These films demonstrate an SER IV fracture-dislocation of his left ankle with a displaced distal fibular fracture and a widened mortise medially without medial malleolar fracture. No lytic lesions or significant degenerative changes are noted.  Assessment: Unstable fracture dislocation left ankle.  Plan: The treatment options, including both surgical and nonsurgical choices, have been discussed in detail with the patient. The patient would like to proceed with surgical intervention to include open reduction internal fixation of the unstable left ankle fracture. The risks (including bleeding, infection, nerve and/or blood vessel injury, persistent or recurrent pain, loosening or failure of the components, malunion and/or nonunion, need for further surgery, blood clots, strokes, heart attacks or arrhythmias, pneumonia, etc.) and benefits of the surgical procedure were discussed. The  patient states his understanding and agrees to proceed. A formal written consent will be obtained by the nursing staff.

## 2017-07-29 NOTE — Anesthesia Post-op Follow-up Note (Signed)
Anesthesia QCDR form completed.        

## 2017-07-29 NOTE — Transfer of Care (Signed)
Immediate Anesthesia Transfer of Care Note  Patient: Adrian Lang  Procedure(s) Performed: Procedure(s): OPEN REDUCTION INTERNAL FIXATION (ORIF) ANKLE FRACTURE (Left)  Patient Location: PACU  Anesthesia Type:General  Level of Consciousness: sedated  Airway & Oxygen Therapy: Patient Spontanous Breathing and Patient connected to face mask oxygen  Post-op Assessment: Report given to RN and Post -op Vital signs reviewed and stable  Post vital signs: Reviewed and stable  Last Vitals:  Vitals:   07/29/17 1421 07/29/17 1629  BP: 130/76 138/72  Pulse: 83 94  Resp: 20 13  Temp: 36.7 C 36.9 C  SpO2: 96% 100%    Complications: No apparent anesthesia complications

## 2017-07-29 NOTE — Progress Notes (Signed)
Patient not able to urinate for awhile per pt report and complaining of nausea. MD paged times two- awaiting call back. IVF fluids paused and bladder scan performed with present

## 2017-07-30 ENCOUNTER — Encounter: Payer: Self-pay | Admitting: Surgery

## 2017-07-30 LAB — BASIC METABOLIC PANEL
Anion gap: 7 (ref 5–15)
BUN: 17 mg/dL (ref 6–20)
CHLORIDE: 104 mmol/L (ref 101–111)
CO2: 28 mmol/L (ref 22–32)
CREATININE: 0.8 mg/dL (ref 0.61–1.24)
Calcium: 8.7 mg/dL — ABNORMAL LOW (ref 8.9–10.3)
GFR calc Af Amer: >60 mL/min (ref >60)
GFR calc non Af Amer: >60 mL/min (ref >60)
Glucose, Bld: 132 mg/dL — ABNORMAL HIGH (ref 65–99)
POTASSIUM: 4 mmol/L (ref 3.5–5.1)
Sodium: 139 mmol/L (ref 135–145)

## 2017-07-30 MED ORDER — ASPIRIN EC 325 MG PO TBEC: 325.0000 mg | DELAYED_RELEASE_TABLET | Freq: Every day | ORAL | 0 refills | Status: DC

## 2017-07-30 MED ORDER — OXYCODONE HCL 5 MG PO TABS: 5.0000 mg | ORAL_TABLET | ORAL | 0 refills | Status: DC | PRN

## 2017-07-30 NOTE — Discharge Summary (Signed)
Physician Discharge Summary  Subjective: 1 Day Post-Op Procedure(s) (LRB): OPEN REDUCTION INTERNAL FIXATION (ORIF) ANKLE FRACTURE (Left) Patient reports pain as mild.   Patient seen in rounds with Dr. Joice Lang. Patient is well, and has had no acute complaints or problems Patient is ready to go Home. The patient will do physical therapy before discharge.  Physician Discharge Summary  Patient ID: Adrian Lang MRN: [id number] DOB/AGE: 33-Jun-1987 33 y.o.  Admit date: 07/28/2017 Discharge date: 07/30/2017  Admission Diagnoses:  Discharge Diagnoses:  Active Problems:   Ankle fracture   Discharged Condition: good  Hospital Course: The patient is postop day 1 from a left ankle open reduction internal fixation. The patient did well after surgery with his pain management. He is eating normally. The patient has normal vitals. He is ready to go home. He will do physical therapy this morning.  Treatments: surgery:  Open reduction and internal fixation of unstable distal fibular fracture, left ankle.  Surgeon:   Adrian Amos, Adrian Lang  Assistant:   None  Anesthesia:   General LMA  Findings:   As above.  Complications:   None  EBL:   5 cc  Fluids:   800 cc crystalloid  UOP:   None  TT:   60 min at 250 mmHg  Drains:   None  Closure:   Staples  Implants:   Biomet ALPS 8-hole composite locking plate and screws  Discharge Exam: Blood pressure 120/67, pulse 82, temperature 98.6 F (37 C), temperature source Oral, resp. rate 17, height  (1.803 m), weight 117.8 kg (259 lb 11.2 oz), SpO2 98 %.   Disposition: Final discharge disposition not confirmed   Allergies as of 07/30/2017   No Known Allergies     Medication List    TAKE these medications   aspirin EC 325 MG tablet Take 1 tablet (325 mg total) by mouth daily.   oxyCODONE 5 MG immediate release tablet Commonly known as:  Oxy IR/ROXICODONE Take 1-2 tablets (5-10 mg total) by mouth every 4 (four) hours  as needed for breakthrough pain.            Discharge Care Instructions        Start     Ordered   07/30/17 0000  oxyCODONE (OXY IR/ROXICODONE) 5 MG immediate release tablet  Every 4 hours PRN     07/30/17 0621   07/30/17 0000  aspirin EC 325 MG tablet  Daily     07/30/17 4098     Follow-up Information    Adrian Lang, Adrian Seltzer, Adrian Lang. Schedule an appointment as soon as possible for a visit in 1 week(s).   Specialty:  Surgery Why:  For postop care Contact information: [address] [facility] [place] [address] [phone]           Signed: Lenard Lang, Adrian Lang 07/30/2017, 6:21 AM   Objective: Vital signs in last 24 hours: Temp:  [98.1 F (36.7 C)-98.9 F (37.2 C)] 98.6 F (37 C) (09/26 1900) Pulse Rate:  [65-99] 82 (09/27 0001) Resp:  [7-20] 17 (09/26 1900) BP: (118-139)/(61-82) 120/67 (09/27 0001) SpO2:  [90 %-100 %] 98 % (09/27 0001)  Intake/Output from previous day:  Intake/Output Summary (Last 24 hours) at 07/30/17 0621 Last data filed at 07/30/17 0415  Gross per 24 hour  Intake             1100 ml  Output             2730 ml  Net            -  1630 ml    Intake/Output this shift: Total I/O In: -  Out: 1700 [Urine:1700]  Labs:  Recent Labs  07/28/17 2115  HGB 14.7    Recent Labs  07/28/17 2115  WBC 13.3*  RBC 5.23  HCT 43.4  PLT 193    Recent Labs  07/28/17 2115 07/30/17 0514  NA 141 139  K 4.0 4.0  CL 103 104  CO2 27 28  BUN 23* 17  CREATININE 1.14 0.80  GLUCOSE 107* 132*  CALCIUM 9.4 8.7*   No results for input(s): LABPT, INR in the last 72 hours.  EXAM: General - Patient is Alert and Oriented Extremity - Neurologically intact Sensation intact distally Incision - clean, dry, no drainage Motor Function -  able to move his toes.  Assessment/Plan: 1 Day Post-Op Procedure(s) (LRB): OPEN REDUCTION INTERNAL FIXATION (ORIF) ANKLE FRACTURE (Left) Procedure(s) (LRB): OPEN REDUCTION INTERNAL FIXATION (ORIF) ANKLE  FRACTURE (Left) History reviewed. No pertinent past medical history. Active Problems:   Ankle fracture  Estimated body mass index is 36.22 kg/m as calculated from the following:   Height as of this encounter:  (1.803 m).   Weight as of this encounter: 117.8 kg (259 lb 11.2 oz). Advance diet Up with therapy D/C IV fluids  Discharge home today. Diet - Regular diet Follow up - in 1 week Activity - NWB Disposition - Home Condition Upon Discharge - Good DVT Prophylaxis - Aspirin  Adrian Skeens, PA-C Orthopaedic Surgery 07/30/2017, 6:21 AM

## 2017-07-30 NOTE — Progress Notes (Signed)
  Subjective: 1 Day Post-Op Procedure(s) (LRB): OPEN REDUCTION INTERNAL FIXATION (ORIF) ANKLE FRACTURE (Left) Patient reports pain as mild.   Patient seen in rounds with Dr. Joice Lofts. Patient is well, and has had no acute complaints or problems Plan is to go Home after hospital stay. Negative for chest pain and shortness of breath Fever: no Gastrointestinal: Negative for nausea and vomiting  Objective: Vital signs in last 24 hours: Temp:  [98.1 F (36.7 C)-98.9 F (37.2 C)] 98.6 F (37 C) (09/26 1900) Pulse Rate:  [65-99] 82 (09/27 0001) Resp:  [7-20] 17 (09/26 1900) BP: (118-139)/(61-82) 120/67 (09/27 0001) SpO2:  [90 %-100 %] 98 % (09/27 0001)  Intake/Output from previous day:  Intake/Output Summary (Last 24 hours) at 07/30/17 0611 Last data filed at 07/30/17 0415  Gross per 24 hour  Intake             1100 ml  Output             2730 ml  Net            -1630 ml    Intake/Output this shift: Total I/O In: -  Out: 1700 [Urine:1700]  Labs:  Recent Labs  07/28/17 2115  HGB 14.7    Recent Labs  07/28/17 2115  WBC 13.3*  RBC 5.23  HCT 43.4  PLT 193    Recent Labs  07/28/17 2115 07/30/17 0514  NA 141 139  K 4.0 4.0  CL 103 104  CO2 27 28  BUN 23* 17  CREATININE 1.14 0.80  GLUCOSE 107* 132*  CALCIUM 9.4 8.7*   No results for input(s): LABPT, INR in the last 72 hours.   EXAM General - Patient is Alert and Oriented Extremity - Sensation intact distally Dressing/Incision - clean, dry, no drainage Motor Function - intact, moving foot and toes well on exam.   History reviewed. No pertinent past medical history.  Assessment/Plan: 1 Day Post-Op Procedure(s) (LRB): OPEN REDUCTION INTERNAL FIXATION (ORIF) ANKLE FRACTURE (Left) Active Problems:   Ankle fracture  Estimated body mass index is 36.22 kg/m as calculated from the following:   Height as of this encounter:  (1.803 m).   Weight as of this encounter: 117.8 kg (259 lb 11.2 oz). DC  IV. Physical therapy for ambulation with nonweightbearing. Increase diet. Discharge home today.   DVT Prophylaxis - Lovenox Non Weight-Bearing to left leg  [name], PA-C Orthopaedic Surgery 07/30/2017, 6:11 AM

## 2017-07-30 NOTE — Care Management (Signed)
Patient wants to pay privately for crutches- delivered and obtain knee scooter which he just did well with PT on.

## 2017-07-30 NOTE — Progress Notes (Signed)
Clinical Child psychotherapist (CSW) received SNF consult. Patient ambulated around the nurses station with PT today and will D/C home today. RN case manager aware of above. Please reconsult if future social work needs arise. CSW signing off.   [employer], LCSW [phone]

## 2017-07-30 NOTE — Progress Notes (Signed)
Patient is being discharged home. Script for scooter, pain meds, reviewed with patient. Wife at bedside during discharge. Reviewed pain meds and S&S of infection and meds. Activity, diet, and dressing care discussed. Allowed time for questions IV removed with cath intact.

## 2017-07-30 NOTE — Discharge Instructions (Signed)
INSTRUCTIONS AFTER Surgery  o Remove items at home which could result in a fall. This includes throw rugs or furniture in walking pathways o ICE to the affected joint every three hours while awake for 30 minutes at a time, for at least the first 3-5 days, and then as needed for pain and swelling.  Continue to use ice for pain and swelling. You may notice swelling that will progress down to the foot and ankle.  This is normal after surgery.  Elevate your leg when you are not up walking on it.   o Continue to use the breathing machine you got in the hospital (incentive spirometer) which will help keep your temperature down.  It is common for your temperature to cycle up and down following surgery, especially at night when you are not up moving around and exerting yourself.  The breathing machine keeps your lungs expanded and your temperature down.   DIET:  As you were doing prior to hospitalization, we recommend a well-balanced diet.  DRESSING / WOUND CARE / SHOWERING  The splint and bandages can stay intact until follow-up in orthopedics.  ACTIVITY  o Increase activity slowly as tolerated, but follow the weight bearing instructions below.   o No driving for 6 weeks or until further direction given by your physician.  You cannot drive while taking narcotics.  o No lifting or carrying greater than 10 lbs. until further directed by your surgeon. o Avoid periods of inactivity such as sitting longer than an hour when not asleep. This helps prevent blood clots.  o You may return to work once you are authorized by your doctor.     WEIGHT BEARING   Other:  Nonweightbearing on the left side   EXERCISES  Results after joint surgery are often greatly improved when you follow the exercise, range of motion and muscle strengthening exercises prescribed by your doctor. Safety measures are also important to protect the joint from further injury. Any time any of these exercises cause you to have  increased pain or swelling, decrease what you are doing until you are comfortable again and then slowly increase them. If you have problems or questions, call your caregiver or physical therapist for advice.   Rehabilitation is important following a joint surgery. After just a few days of immobilization, the muscles of the leg can become weakened and shrink (atrophy).  These exercises are designed to build up the tone and strength of the thigh and leg muscles and to improve motion. Often times heat used for twenty to thirty minutes before working out will loosen up your tissues and help with improving the range of motion but do not use heat for the first two weeks following surgery (sometimes heat can increase post-operative swelling).   These exercises can be done on a training (exercise) mat, on the floor, on a table or on a bed. Use whatever works the best and is most comfortable for you.    Use music or television while you are exercising so that the exercises are a pleasant break in your day. This will make your life better with the exercises acting as a break in your routine that you can look forward to.   Perform all exercises about fifteen times, three times per day or as directed.  You should exercise both the operative leg and the other leg as well.  Exercises include:    Quad Sets - Tighten up the muscle on the front of the thigh [employer]) and  hold for 5-10 seconds.    Straight Leg Raises - With your knee straight (if you were given a brace, keep it on), lift the leg to 60 degrees, hold for 3 seconds, and slowly lower the leg.  Perform this exercise against resistance later as your leg gets stronger.   Leg Slides: Lying on your back, slowly slide your foot toward your buttocks, bending your knee up off the floor (only go as far as is comfortable). Then slowly slide your foot back down until your leg is flat on the floor again.   Angel Wings: Lying on your back spread your legs to the side as  far apart as you can without causing discomfort.   Hamstring Strength:  Lying on your back, push your heel against the floor with your leg straight by tightening up the muscles of your buttocks.  Repeat, but this time bend your knee to a comfortable angle, and push your heel against the floor.  You may put a pillow under the heel to make it more comfortable if necessary.   A rehabilitation program following joint surgery can speed recovery and prevent re-injury in the future due to weakened muscles. Contact your doctor or a physical therapist for more information on knee rehabilitation.    CONSTIPATION  Constipation is defined medically as fewer than three stools per week and severe constipation as less than one stool per week.  Even if you have a regular bowel pattern at home, your normal regimen is likely to be disrupted due to multiple reasons following surgery.  Combination of anesthesia, postoperative narcotics, change in appetite and fluid intake all can affect your bowels.   YOU MUST use at least one of the following options; they are listed in order of increasing strength to get the job done.  They are all available over the counter, and you may need to use some, POSSIBLY even all of these options:    Drink plenty of fluids (prune juice may be helpful) and high fiber foods Colace 100 mg by mouth twice a day  Senokot for constipation as directed and as needed Dulcolax (bisacodyl), take with full glass of water  Miralax (polyethylene glycol) once or twice a day as needed.  If you have tried all these things and are unable to have a bowel movement in the first 3-4 days after surgery call either your surgeon or your primary doctor.    If you experience loose stools or diarrhea, hold the medications until you stool forms back up.  If your symptoms do not get better within 1 week or if they get worse, check with your doctor.  If you experience "the worst abdominal pain ever" or develop nausea  or vomiting, please contact the office immediately for further recommendations for treatment.   ITCHING:  If you experience itching with your medications, try taking only a single pain pill, or even half a pain pill at a time.  You can also use Benadryl over the counter for itching or also to help with sleep.   MEDICATIONS:  See your medication summary on the After Visit Summary that nursing will review with you.  You may have some home medications which will be placed on hold until you complete the course of blood thinner medication.  It is important for you to complete the blood thinner medication as prescribed.  PRECAUTIONS:  If you experience chest pain or shortness of breath - call 911 immediately for transfer to the hospital emergency department.  If you develop a fever greater that 101 F, purulent drainage from wound, increased redness or drainage from wound, foul odor from the wound/dressing, or calf pain - CONTACT YOUR SURGEON.                                                   FOLLOW-UP APPOINTMENTS:  If you do not already have a post-op appointment, please call the office for an appointment to be seen by your surgeon.  Guidelines for how soon to be seen are listed in your After Visit Summary, but are typically between 1-4 weeks after surgery.  OTHER INSTRUCTIONS:   Use crutches or walker for ambulation. Keep elevated and ice on a close needed.  MAKE SURE YOU:   Understand these instructions.   Get help right away if you are not doing well or get worse.    Thank you for letting us be a part of your medical care team.  It is a privilege we respect greatly.  We hope these instructions will help you stay on track for a fast and full recovery!

## 2017-07-30 NOTE — Evaluation (Signed)
Physical Therapy Evaluation Patient Details Name: Adrian Lang MRN: [id number] DOB: 1986/08/23 Today's Date: 07/30/2017   History of Present Illness  31 y/o male who suffered ankle fracture playing softball.  s/p ORIF 9/26.  Clinical Impression  Pt did well with ambulation using crutches but did have a little hesitancy with stair negotiation using crutches (opted for backward strategy to avoid hitting toes on trailing steps.)   Pt was able to circumambulate the nurses station with crutches but was much more confidence and effective with the knee scooter.  Pt is safe to return home from PT stand point.     Follow Up Recommendations DC plan and follow up therapy as arranged by surgeon    Equipment Recommendations  Crutches (knee scooter)    Recommendations for Other Services       Precautions / Restrictions Restrictions Weight Bearing Restrictions: Yes LLE Weight Bearing: Non weight bearing      Mobility  Bed Mobility Overal bed mobility: Independent                Transfers Overall transfer level: Independent Equipment used: Crutches             General transfer comment: With minimal cuing pt was able to get himself up to standing with crutches while maintaining WBing status.  Ambulation/Gait Ambulation/Gait assistance: Min guard Ambulation Distance (Feet): 250 Feet Assistive device: Crutches       General Gait Details: Pt with some initial hesitancy with crutches, but was able to easily do swing-through strategy for the majority of the loop.  Of note: pt also able to use knee scooter to do full loop with increased safety, less fatigue and overall much greater ease.  Stairs Stairs: Yes Stairs assistance: Min guard Stair Management: No rails;Backwards;With crutches Number of Stairs: 3 General stair comments: Pt was able to negotiate steps w/o L WBing, showed good effort but did have some hesitancy.  Instructed pt on possibility of going up forward once knee  bending is more confident (splint somewhat limiting) also discussed option of scooting up backward on his butt.  Wheelchair Mobility    Modified Rankin (Stroke Patients Only)       Balance Overall balance assessment: Modified Independent                                           Pertinent Vitals/Pain Pain Assessment: 0-10 Pain Score: 5     Home Living Family/patient expects to be discharged to:: Private residence Living Arrangements: Spouse/significant other;Children Available Help at Discharge: Family Type of Home: House Home Access: Stairs to enter Entrance Stairs-Rails: None Secretary/administrator of Steps: 3 Home Layout: Able to live on main level with bedroom/bathroom Home Equipment: None      Prior Function Level of Independence: Independent         Comments: Pt working as Emergency planning/management officer, 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     Higher education careers adviser        Extremity/Trunk Assessment   Upper Extremity Assessment Upper Extremity Assessment: Overall WFL for tasks assessed    Lower Extremity Assessment Lower Extremity Assessment: Overall WFL for tasks assessed (except L LE in splint, not tested)       Communication   Communication: No difficulties  Cognition Arousal/Alertness: Awake/alert Behavior During Therapy: WFL for tasks assessed/performed Overall Cognitive Status: Within Functional Limits for tasks assessed  General Comments      Exercises     Assessment/Plan    PT Assessment Patient needs continued PT services  PT Problem List Decreased strength;Decreased range of motion;Decreased activity tolerance;Decreased balance;Decreased mobility;Decreased knowledge of use of DME;Decreased safety awareness;Pain       PT Treatment Interventions DME instruction;Gait training;Stair training;Functional mobility training;Therapeutic activities;Neuromuscular re-education;Balance training;Therapeutic  exercise;Patient/family education    PT Goals (Current goals can be found in the Care Plan section)  Acute Rehab PT Goals Patient Stated Goal: get better and get back to work PT Goal Formulation: With patient Time For Goal Achievement: 08/13/17 Potential to Achieve Goals: Good    Frequency BID   Barriers to discharge        Co-evaluation               AM-PAC PT "6 Clicks" Daily Activity  Outcome Measure Difficulty turning over in bed (including adjusting bedclothes, sheets and blankets)?: None Difficulty moving from lying on back to sitting on the side of the bed? : None Difficulty sitting down on and standing up from a chair with arms (e.g., wheelchair, bedside commode, etc,.)?: None Help needed moving to and from a bed to chair (including a wheelchair)?: None Help needed walking in hospital room?: None Help needed climbing 3-5 steps with a railing? : A Little 6 Click Score: 23    End of Session Equipment Utilized During Treatment: Gait belt Activity Tolerance: Patient tolerated treatment well Patient left: in bed;with call bell/phone within reach;with family/visitor present Nurse Communication: Mobility status PT Visit Diagnosis: Difficulty in walking, not elsewhere classified (R26.2);Unsteadiness on feet (R26.81)    Time: 1610-9604 PT Time Calculation (min) (ACUTE ONLY): 30 min   Charges:   PT Evaluation $PT Eval Low Complexity: 1 Low PT Treatments $Gait Training: 8-22 mins   PT G Codes:   PT G-Codes **NOT FOR INPATIENT CLASS** Functional Assessment Tool Used: AM-PAC 6 Clicks Basic Mobility Functional Limitation: Mobility: Walking and moving around Mobility: Walking and Moving Around Current Status (V4098): At least 1 percent but less than 20 percent impaired, limited or restricted Mobility: Walking and Moving Around Goal Status [id number]): At least 1 percent but less than 20 percent impaired, limited or restricted    Malachi Pro, DPT 07/30/2017, 10:44  AM

## 2017-07-30 NOTE — Care Management (Addendum)
Met with patient and his wife was present this visit. Patient states he has worked with PT and he used crutches without difficulty. I have requested crutches to be delivered today prior to [facility]. No other RNCM needs. Rx for crutches requested from MD. Patient/wife said that "Dr. Roland Rack told them he would be bedrest for 12 days and then follow up with Dr. Roland Rack- at that time he will be able to bear weight on the leg".  I have text Dr. Roland Rack for clarification. They both said Dr. Roland Rack agreed with knee scooter however patient declined test-drive the knee scooter with PT.

## 2018-03-22 ENCOUNTER — Ambulatory Visit
Admission: RE | Admit: 2018-03-22 | Discharge: 2018-03-22 | Disposition: A | Discharge status: Home / Self Care | Payer: BLUE CROSS/BLUE SHIELD | Source: Ambulatory Visit | Attending: Family Medicine | Admitting: Family Medicine

## 2018-03-22 ENCOUNTER — Other Ambulatory Visit: Payer: Self-pay | Admitting: Family Medicine

## 2018-03-22 DIAGNOSIS — M545 Low back pain: Secondary | ICD-10-CM | POA: Diagnosis present

## 2018-10-06 ENCOUNTER — Ambulatory Visit: Payer: BLUE CROSS/BLUE SHIELD | Admitting: Anesthesiology

## 2018-10-06 ENCOUNTER — Encounter
Admission: RE | Disposition: A | Discharge status: Home / Self Care | Payer: Self-pay | Source: Ambulatory Visit | Attending: Surgery

## 2018-10-06 ENCOUNTER — Ambulatory Visit
Admission: RE | Admit: 2018-10-06 | Discharge: 2018-10-06 | Disposition: A | Discharge status: Home / Self Care | Payer: BLUE CROSS/BLUE SHIELD | Source: Ambulatory Visit | Attending: Surgery | Admitting: Surgery

## 2018-10-06 DIAGNOSIS — M25872 Other specified joint disorders, left ankle and foot: Secondary | ICD-10-CM | POA: Diagnosis not present

## 2018-10-06 HISTORY — PX: ANKLE ARTHROSCOPY: SHX545

## 2018-10-06 HISTORY — DX: Unspecified osteoarthritis, unspecified site: M19.90

## 2018-10-06 SURGERY — ARTHROSCOPY, ANKLE
Anesthesia: Regional | Site: Ankle | Laterality: Left

## 2018-10-06 MED ORDER — OXYCODONE HCL 5 MG/5ML PO SOLN: 5.0000 mg | Freq: Once | ORAL | Status: DC | PRN

## 2018-10-06 MED ORDER — OXYCODONE HCL 5 MG PO TABS: 5.0000 mg | ORAL_TABLET | ORAL | 0 refills | Status: DC | PRN

## 2018-10-06 MED ORDER — DEXAMETHASONE SODIUM PHOSPHATE 4 MG/ML IJ SOLN
INTRAMUSCULAR | Status: DC | PRN
  Administered 2018-10-06: 4 mg via INTRAVENOUS

## 2018-10-06 MED ORDER — DEXTROSE 5 % IV SOLN
3000.0000 mg | Freq: Once | INTRAVENOUS | Status: AC
  Administered 2018-10-06: 3000 mg via INTRAVENOUS

## 2018-10-06 MED ORDER — METOCLOPRAMIDE HCL 5 MG/ML IJ SOLN: 5.0000 mg | Freq: Three times a day (TID) | INTRAMUSCULAR | Status: DC | PRN

## 2018-10-06 MED ORDER — GLYCOPYRROLATE 0.2 MG/ML IJ SOLN
INTRAMUSCULAR | Status: DC | PRN
  Administered 2018-10-06: 0.1 mg via INTRAVENOUS

## 2018-10-06 MED ORDER — PROPOFOL 10 MG/ML IV BOLUS
INTRAVENOUS | Status: DC | PRN
  Administered 2018-10-06: 200 mg via INTRAVENOUS

## 2018-10-06 MED ORDER — ROPIVACAINE HCL 5 MG/ML IJ SOLN
INTRAMUSCULAR | Status: DC | PRN
  Administered 2018-10-06: 40 mL via PERINEURAL

## 2018-10-06 MED ORDER — OXYCODONE HCL 5 MG PO TABS: 5.0000 mg | ORAL_TABLET | Freq: Once | ORAL | Status: DC | PRN

## 2018-10-06 MED ORDER — LIDOCAINE HCL (CARDIAC) PF 100 MG/5ML IV SOSY
PREFILLED_SYRINGE | INTRAVENOUS | Status: DC | PRN
  Administered 2018-10-06: 40 mg via INTRATRACHEAL

## 2018-10-06 MED ORDER — LACTATED RINGERS IV SOLN
INTRAVENOUS | Status: DC
  Administered 2018-10-06: 11:00:00 via INTRAVENOUS

## 2018-10-06 MED ORDER — ONDANSETRON HCL 4 MG PO TABS: 4.0000 mg | ORAL_TABLET | Freq: Four times a day (QID) | ORAL | Status: DC | PRN

## 2018-10-06 MED ORDER — ONDANSETRON HCL 4 MG/2ML IJ SOLN: 4.0000 mg | Freq: Four times a day (QID) | INTRAMUSCULAR | Status: DC | PRN

## 2018-10-06 MED ORDER — MIDAZOLAM HCL 5 MG/5ML IJ SOLN
INTRAMUSCULAR | Status: DC | PRN
  Administered 2018-10-06: 2 mg via INTRAVENOUS

## 2018-10-06 MED ORDER — LIDOCAINE-EPINEPHRINE 1 %-1:100000 IJ SOLN
INTRAMUSCULAR | Status: DC | PRN
  Administered 2018-10-06: 20 mL via INTRAMUSCULAR

## 2018-10-06 MED ORDER — POTASSIUM CHLORIDE IN NACL 20-0.9 MEQ/L-% IV SOLN: INTRAVENOUS | Status: DC

## 2018-10-06 MED ORDER — FENTANYL CITRATE (PF) 100 MCG/2ML IJ SOLN
25.0000 ug | INTRAMUSCULAR | Status: DC | PRN
  Administered 2018-10-06: 100 ug via INTRAVENOUS

## 2018-10-06 MED ORDER — OXYCODONE HCL 5 MG PO TABS: 5.0000 mg | ORAL_TABLET | ORAL | Status: DC | PRN

## 2018-10-06 MED ORDER — HYDROMORPHONE HCL 1 MG/ML IJ SOLN: 0.2500 mg | INTRAMUSCULAR | Status: DC | PRN

## 2018-10-06 MED ORDER — ONDANSETRON HCL 4 MG/2ML IJ SOLN
INTRAMUSCULAR | Status: DC | PRN
  Administered 2018-10-06: 4 mg via INTRAVENOUS

## 2018-10-06 MED ORDER — METOCLOPRAMIDE HCL 5 MG PO TABS: 5.0000 mg | ORAL_TABLET | Freq: Three times a day (TID) | ORAL | Status: DC | PRN

## 2018-10-06 SURGICAL SUPPLY — 21 items
BANDAGE ELASTIC 4 LF NS (GAUZE/BANDAGES/DRESSINGS) ×3 IMPLANT
BNDG ESMARK 4X12 TAN STRL LF (GAUZE/BANDAGES/DRESSINGS) ×3 IMPLANT
CHLORAPREP W/TINT 26ML (MISCELLANEOUS) ×3 IMPLANT
COVER LIGHT HANDLE UNIVERSAL (MISCELLANEOUS) ×6 IMPLANT
CUFF TOURN SGL QUICK 24 (TOURNIQUET CUFF) ×2
CUFF TRNQT CYL 24X4X40X1 (TOURNIQUET CUFF) ×1 IMPLANT
DRAPE IMP U-DRAPE 54X76 (DRAPES) ×3 IMPLANT
GAUZE SPONGE 4X4 12PLY STRL (GAUZE/BANDAGES/DRESSINGS) ×3 IMPLANT
GLOVE BIO SURGEON STRL SZ8 (GLOVE) ×6 IMPLANT
GLOVE INDICATOR 8.0 STRL GRN (GLOVE) ×6 IMPLANT
GOWN STRL REUS W/ TWL LRG LVL3 (GOWN DISPOSABLE) ×1 IMPLANT
GOWN STRL REUS W/ TWL XL LVL3 (GOWN DISPOSABLE) ×1 IMPLANT
GOWN STRL REUS W/TWL LRG LVL3 (GOWN DISPOSABLE) ×2
GOWN STRL REUS W/TWL XL LVL3 (GOWN DISPOSABLE) ×2
IV LACTATED RINGER IRRG 3000ML (IV SOLUTION) ×2
IV LR IRRIG 3000ML ARTHROMATIC (IV SOLUTION) ×1 IMPLANT
KIT TURNOVER KIT A (KITS) ×3 IMPLANT
PACK ARTHROSCOPY KNEE (MISCELLANEOUS) ×3 IMPLANT
RESECTOR FULL RADIUS 2.9 (ORTHOPEDIC DISPOSABLE SUPPLIES) ×3 IMPLANT
SUT PROLENE 4 0 PS 2 18 (SUTURE) ×3 IMPLANT
TUBING ARTHRO INFLOW-ONLY STRL (TUBING) ×3 IMPLANT

## 2018-10-06 NOTE — H&P (Signed)
Paper H&P to be scanned into permanent record. H&P reviewed and patient re-examined. No changes. 

## 2018-10-06 NOTE — Anesthesia Postprocedure Evaluation (Signed)
Anesthesia Post Note  Patient: Adrian Lang  Procedure(s) Performed: ARTHROSCOPIC DEBRIDEMENT OF PAINFUL SCAR TISSUE FROM LEFT ANKLE RETAINED ORTHOPEDIC HARDWARE (Left Ankle)  Patient location during evaluation: PACU Anesthesia Type: Regional Level of consciousness: awake and alert Pain management: pain level controlled Vital Signs Assessment: post-procedure vital signs reviewed and stable Respiratory status: spontaneous breathing Cardiovascular status: blood pressure returned to baseline and stable Anesthetic complications: no    Adrian Lang, III,  Alaa Mullally D

## 2018-10-06 NOTE — Anesthesia Procedure Notes (Signed)
Procedure Name: LMA Insertion Date/Time: 10/06/2018 11:44 AM Performed by: Jimmy PicketAmyot, Etta Gassett, CRNA Pre-anesthesia Checklist: Patient identified, Emergency Drugs available, Suction available, Timeout performed and Patient being monitored Patient Re-evaluated:Patient Re-evaluated prior to induction Oxygen Delivery Method: Circle system utilized Preoxygenation: Pre-oxygenation with 100% oxygen Induction Type: IV induction LMA: LMA inserted LMA Size: 4.0 Number of attempts: 1 Placement Confirmation: positive ETCO2 and breath sounds checked- equal and bilateral Tube secured with: Tape

## 2018-10-06 NOTE — Discharge Instructions (Addendum)
General Anesthesia, Adult, Care After These instructions provide you with information about caring for yourself after your procedure. Your health care provider may also give you more specific instructions. Your treatment has been planned according to current medical practices, but problems sometimes occur. Call your health care provider if you have any problems or questions after your procedure. What can I expect after the procedure? After the procedure, it is common to have:  Vomiting.  A sore throat.  Mental slowness.  It is common to feel:  Nauseous.  Cold or shivery.  Sleepy.  Tired.  Sore or achy, even in parts of your body where you did not have surgery.  Follow these instructions at home: For at least 24 hours after the procedure:  Do not: ? Participate in activities where you could fall or become injured. ? Drive. ? Use heavy machinery. ? Drink alcohol. ? Take sleeping pills or medicines that cause drowsiness. ? Make important decisions or sign legal documents. ? Take care of children on your own.  Rest. Eating and drinking  If you vomit, drink water, juice, or soup when you can drink without vomiting.  Drink enough fluid to keep your urine clear or pale yellow.  Make sure you have little or no nausea before eating solid foods.  Follow the diet recommended by your health care provider. General instructions  Have a responsible adult stay with you until you are awake and alert.  Return to your normal activities as told by your health care provider. Ask your health care provider what activities are safe for you.  Take over-the-counter and prescription medicines only as told by your health care provider.  If you smoke, do not smoke without supervision.  Keep all follow-up visits as told by your health care provider. This is important. Contact a health care provider if:  You continue to have nausea or vomiting at home, and medicines are not helpful.  You  cannot drink fluids or start eating again.  You cannot urinate after 8-12 hours.  You develop a skin rash.  You have fever.  You have increasing redness at the site of your procedure. Get help right away if:  You have difficulty breathing.  You have chest pain.  You have unexpected bleeding.  You feel that you are having a life-threatening or urgent problem. This information is not intended to replace advice given to you by your health care provider. Make sure you discuss any questions you have with your health care provider. Document Released: 01/26/2001 Document Revised: 03/24/2016 Document Reviewed: 10/04/2015 Elsevier Interactive Patient Education  2018 [employer]   Orthopedic discharge instructions: Keep dressing dry and intact.  May shower after dressing changed on post-op day #4 (Saturday).  Cover sutures with Band-Aids after drying off. Apply ice frequently to ankle. Take ibuprofen 800 mg TID with meals for 7-10 days, then as necessary. Take pain medication as prescribed or ES Tylenol when needed.  May weight-bear as tolerated - use crutches or walker as needed. Use CAM walker boot as needed for comfort. Follow-up in 10-14 days or as scheduled.

## 2018-10-06 NOTE — Op Note (Signed)
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  Post-Op Diagnosis:   Same.  Procedure:   Arthroscopic debridement, left ankle.  Surgeon:   Maryagnes AmosJ. Jeffrey Poggi, MD  Assistant:   None  Anesthesia:   General LMA with popliteal block placed preoperatively by anesthesiologist  Findings:   As above.  The articular surfaces of the tibia and talus both were in satisfactory condition.  Complications:   None  EBL:   2 cc  Fluids:   800 cc crystalloid  TT:   None  Drains:   None  Closure:   4-0 Prolene interrupted sutures  Brief Clinical Note:   The patient is a 32 year old male who is now nearly 15 months status post an open reduction and internal fixation of his left ankle fracture. The patient has done well from this surgery and has gone on to resume his normal daily activities. However, over the past 6 months or so, he has noted increasing discomfort in the anterior aspect of his ankle. His history and examination are consistent with anterior impingement, presumably due to scar tissue developing following his prior ankle injury. The patient presents at this time for definitive management of his left ankle symptoms.  Procedure:   The patient underwent placement of a popliteal block in the preoperative holding area by the anesthesiologist before he was brought into the operating room and lain in the supine position. After adequate general laryngeal mask anesthesia was obtained, a timeout was performed to verify the appropriate surgical site before the joint was injected sterilely using a solution of 10 cc of 1% lidocaine with epinephrine and 10 cc of 0.5% Sensorcaine.  The left foot and lower leg were prepped with ChloraPrep solution, then draped sterilely. Preoperative antibiotics were administered. A 1 cm incision was made over the anterolateral aspect of the left ankle joint. The  subcutaneous tissues were dissected bluntly down to the capsule before the capsule was penetrated with a small trocar and the small joint camera introduced. A second anteromedial portal was created similarly and the 2.9 full-radius resector introduced through this portal. The anteromedial aspect of the joint was inspected thoroughly with the findings as described above. The abundant reactive synovial and scar tissues anteromedially were debrided back to stable margins using the full-radius resector.  The camera was repositioned in the anteromedial portal and the full-radius resector introduced through the anterolateral portal. Again, the abundant reactive synovial scar tissues anterolaterally were debrided back to stable margins using the full-radius resector. The arthroscopic equipment were removed from the joint after suctioning the excess fluid. The portal sites were reapproximated using 4-0 Prolene interrupted sutures before a sterile bulky dressing was applied to the ankle. The patient was then awakened, extubated, and returned to the recovery room in satisfactory condition after tolerating the procedure well.

## 2018-10-06 NOTE — Anesthesia Procedure Notes (Signed)
Anesthesia Regional Block: Popliteal block   Pre-Anesthetic Checklist: ,, timeout performed, Correct Patient, Correct Site, Correct Laterality, Correct Procedure, Correct Position, site marked, Risks and benefits discussed,  Surgical consent,  Pre-op evaluation,  At surgeon's request and post-op pain management  Laterality: Left  Prep: chloraprep       Needles:  Injection technique: Single-shot  Needle Type: Stimiplex     Needle Length: 9cm  Needle Gauge: 21     Additional Needles:   Procedures:,,,, ultrasound used (permanent image in chart),,,,  Narrative:  Start time: 10/06/2018 10:35 AM End time: 10/06/2018 10:53 AM Injection made incrementally with aspirations every 5 mL.  Performed by: Personally  Anesthesiologist: Jolayne Pantherunkle, Rhema Boyett, MD  Additional Notes: Functioning IV was confirmed and monitors applied. Ultrasound guidance: relevant anatomy identified, needle position confirmed, local anesthetic spread visualized around nerve(s)., vascular puncture avoided.  Image printed for medical record.  Negative aspiration and no paresthesias; incremental administration of local anesthetic. The patient tolerated the procedure well. Vitals signes recorded in RN notes.

## 2018-10-06 NOTE — Anesthesia Preprocedure Evaluation (Addendum)
Anesthesia Evaluation  Patient identified by MRN, date of birth, ID band Patient awake    Reviewed: Allergy & Precautions, H&P , NPO status , Patient's Chart, lab work & pertinent test results  Airway Mallampati: I  TM Distance: >3 FB Neck ROM: full    Dental no notable dental hx.    Pulmonary neg pulmonary ROS,    Pulmonary exam normal breath sounds clear to auscultation       Cardiovascular negative cardio ROS Normal cardiovascular exam Rhythm:regular Rate:Normal     Neuro/Psych    GI/Hepatic negative GI ROS, Neg liver ROS,   Endo/Other  negative endocrine ROS  Renal/GU negative Renal ROS  negative genitourinary   Musculoskeletal   Abdominal   Peds  Hematology negative hematology ROS (+)   Anesthesia Other Findings   Reproductive/Obstetrics negative OB ROS                           Anesthesia Physical Anesthesia Plan  ASA: I  Anesthesia Plan: General LMA and Regional   Post-op Pain Management: GA combined w/ Regional for post-op pain   Induction:   PONV Risk Score and Plan:   Airway Management Planned:   Additional Equipment:   Intra-op Plan:   Post-operative Plan:   Informed Consent: I have reviewed the patients History and Physical, chart, labs and discussed the procedure including the risks, benefits and alternatives for the proposed anesthesia with the patient or authorized representative who has indicated his/her understanding and acceptance.     Plan Discussed with:   Anesthesia Plan Comments:        Anesthesia Quick Evaluation

## 2018-10-06 NOTE — Transfer of Care (Signed)
Immediate Anesthesia Transfer of Care Note  Patient: Adrian Lang  Procedure(s) Performed: ARTHROSCOPIC DEBRIDEMENT OF PAINFUL SCAR TISSUE FROM LEFT ANKLE RETAINED ORTHOPEDIC HARDWARE (Left Ankle)  Patient Location: PACU  Anesthesia Type: General LMA, Regional  Level of Consciousness: awake, alert  and patient cooperative  Airway and Oxygen Therapy: Patient Spontanous Breathing and Patient connected to supplemental oxygen  Post-op Assessment: Post-op Vital signs reviewed, Patient's Cardiovascular Status Stable, Respiratory Function Stable, Patent Airway and No signs of Nausea or vomiting  Post-op Vital Signs: Reviewed and stable  Complications: No apparent anesthesia complications

## 2018-10-06 NOTE — Anesthesia Procedure Notes (Signed)
Anesthesia Regional Block: Adductor canal block   Pre-Anesthetic Checklist: ,, timeout performed, Correct Patient, Correct Site, Correct Laterality, Correct Procedure, Correct Position, site marked, Risks and benefits discussed,  Surgical consent,  Pre-op evaluation,  At surgeon's request and post-op pain management  Laterality: Left  Prep: chloraprep       Needles:  Injection technique: Single-shot  Needle Type: Stimiplex     Needle Length: 9cm  Needle Gauge: 21     Additional Needles:   Procedures:,,,, ultrasound used (permanent image in chart),,,,  Narrative:  Start time: 10/06/2018 10:35 AM End time: 10/06/2018 10:53 AM Injection made incrementally with aspirations every 5 mL.  Performed by: Personally  Anesthesiologist: Jolayne Pantherunkle, Corin Formisano, MD  Additional Notes: Functioning IV was confirmed and monitors applied. Ultrasound guidance: relevant anatomy identified, needle position confirmed, local anesthetic spread visualized around nerve(s)., vascular puncture avoided.  Image printed for medical record.  Negative aspiration and no paresthesias; incremental administration of local anesthetic. The patient tolerated the procedure well. Vitals signes recorded in RN notes.

## 2018-10-07 ENCOUNTER — Encounter: Payer: Self-pay | Admitting: Surgery

## 2018-10-07 DIAGNOSIS — Z9889 Other specified postprocedural states: Secondary | ICD-10-CM | POA: Insufficient documentation

## 2019-01-05 ENCOUNTER — Other Ambulatory Visit: Payer: Self-pay | Admitting: Surgery

## 2019-01-05 DIAGNOSIS — Z8781 Personal history of (healed) traumatic fracture: Secondary | ICD-10-CM

## 2019-01-05 DIAGNOSIS — Z9889 Other specified postprocedural states: Principal | ICD-10-CM

## 2019-01-11 ENCOUNTER — Ambulatory Visit (INDEPENDENT_AMBULATORY_CARE_PROVIDER_SITE_OTHER): Payer: BLUE CROSS/BLUE SHIELD | Admitting: Internal Medicine

## 2019-01-11 ENCOUNTER — Encounter: Payer: Self-pay | Admitting: Internal Medicine

## 2019-01-11 VITALS — BP 140/78 | HR 81 | Ht 71.0 in | Wt 271.6 lb

## 2019-01-11 DIAGNOSIS — J4520 Mild intermittent asthma, uncomplicated: Secondary | ICD-10-CM

## 2019-01-11 DIAGNOSIS — R05 Cough: Secondary | ICD-10-CM | POA: Diagnosis not present

## 2019-01-11 DIAGNOSIS — K219 Gastro-esophageal reflux disease without esophagitis: Secondary | ICD-10-CM | POA: Diagnosis not present

## 2019-01-11 DIAGNOSIS — R059 Cough, unspecified: Secondary | ICD-10-CM

## 2019-01-11 MED ORDER — ALBUTEROL SULFATE HFA 108 (90 BASE) MCG/ACT IN AERS: 2.0000 | INHALATION_SPRAY | Freq: Four times a day (QID) | RESPIRATORY_TRACT | 2 refills | Status: AC | PRN

## 2019-01-11 MED ORDER — PANTOPRAZOLE SODIUM 40 MG PO TBEC: 40.0000 mg | DELAYED_RELEASE_TABLET | Freq: Every day | ORAL | 1 refills | Status: DC

## 2019-01-11 NOTE — Patient Instructions (Signed)
STAT PROTONIX FOR GERD  ALBUTEROL AS NEEDED 2-4 puffs every 4 hrs as needed

## 2019-01-11 NOTE — Progress Notes (Signed)
Name: Adrian Lang MRN: [id number] DOB: [birth year]     CONSULTATION DATE: 01/11/2019  REFERRING MD : Shelva Majestic CHIEF COMPLAINT: cough  STUDIES:     07/2017 CXR independently reviewed by Me today No pneumonia No effusions NL CXR    HISTORY OF PRESENT ILLNESS: 33 yo pleasant white male seen today for cough  1 year ago dx with acute viral bronchitis was given prednisone and albuterol -symptoms resolved  Since then he has had a chronic cough with intermittent wheezing that happens only at night He does NOT have any triggers He does NOT have allergic rhinitis  ETOH INTAKE MAKES COUGH AND WHEEZING WORSE He has symptoms of GERD 3-5 times per week Has one dog at home no issues No known allergies Non smoker No second hand smoke exposure  Patient has been in [employer] and has had no previous resp issues  HE has no symptoms with exertion-even though its limited due to ankle issues He has gained 30 pounds in last 1 year  No signs of infection No signs of exacerbation No signs of CHF  Patient uses his wifes albuterol as needed which helps his symptoms He takes the albuterol intermittently on a nightly basis   PAST MEDICAL HISTORY :   has a past medical history of Arthritis.  has a past surgical history that includes Knee surgery (Right); ORIF ankle fracture (Left, 07/29/2017); and Ankle arthroscopy (Left, 10/06/2018). Prior to Admission medications   Medication Sig Start Date End Date Taking? Authorizing Provider  aspirin EC 325 MG tablet Take 1 tablet (325 mg total) by mouth daily. Patient not taking: Reported on 09/27/2018 07/30/17   [name], PA-C  oxyCODONE (OXY IR/ROXICODONE) 5 MG immediate release tablet Take 1-2 tablets (5-10 mg total) by mouth every 4 (four) hours as needed for breakthrough pain. 10/06/18   Poggi, Excell Seltzer, MD   No Known Allergies  FAMILY HISTORY:  +ASTHMA SOCIAL HISTORY:  reports that he has never smoked. He has never used smokeless tobacco. He reports  current alcohol use.  REVIEW OF SYSTEMS:   Constitutional: Negative for fever, chills, weight loss, malaise/fatigue and diaphoresis.  HENT: Negative for hearing loss, ear pain, nosebleeds, congestion, sore throat, neck pain, tinnitus and ear discharge.   Eyes: Negative for blurred vision, double vision, photophobia, pain, discharge and redness.  Respiratory: + cough, -hemoptysis, -sputum production, -shortness of breath, +wheezing and stridor.   Cardiovascular: Negative for chest pain, palpitations, orthopnea, claudication, leg swelling and PND.  Gastrointestinal: Negative for heartburn, nausea, vomiting, abdominal pain, diarrhea, constipation, blood in stool and melena.  Genitourinary: Negative for dysuria, urgency, frequency, hematuria and flank pain.  Musculoskeletal: Negative for myalgias, back pain, joint pain and falls.  Skin: Negative for itching and rash.  Neurological: Negative for dizziness, tingling, tremors, sensory change, speech change, focal weakness, seizures, loss of consciousness, weakness and headaches.  Endo/Heme/Allergies: Negative for environmental allergies and polydipsia. Does not bruise/bleed easily.  ALL OTHER ROS ARE NEGATIVE   BP 140/78   Pulse 81   Ht 5\' 11"  (1.803 m)   Wt 271 lb 9.6 oz (123.2 kg)   SpO2 96%   BMI 37.88 kg/m   Physical Examination:   GENERAL:NAD, no fevers, chills, no weakness no fatigue HEAD: Normocephalic, atraumatic.  EYES: Pupils equal, round, reactive to light. Extraocular muscles intact. No scleral icterus.  MOUTH: Moist mucosal membrane.   EAR, NOSE, THROAT: Clear without exudates. No external lesions.  NECK: Supple. No thyromegaly. No nodules. No JVD.  PULMONARY:CTA B/L no  wheezes, no crackles, no rhonchi CARDIOVASCULAR: S1 and S2. Regular rate and rhythm. No murmurs, rubs, or gallops. No edema.  GASTROINTESTINAL: Soft, nontender, nondistended. No masses. Positive bowel sounds.  MUSCULOSKELETAL: No swelling, clubbing, or  edema. Range of motion full in all extremities.  NEUROLOGIC: Cranial nerves II through XII are intact. No gross focal neurological deficits.  SKIN: No ulceration, lesions, rashes, or cyanosis. Skin warm and dry. Turgor intact.  PSYCHIATRIC: Mood, affect within normal limits. The patient is awake, alert and oriented x 3. Insight, judgment intact.        ASSESSMENT AND PLAN SYNOPSIS   32 yo pleasant white male with signs and symptoms of reactive airways diease associated with GERD in setting of obesity and deconditioned state  Recommend starting PPI with Protonix for 4 weeks Recommend using albuterol as needed 2-4 puffs every 4hrs as needed  Obesity -recommend significant weight loss -recommend changing diet  Deconditioned state -Recommend increased daily activity and exercise   Patient  satisfied with Plan of action and management. All questions answered  Lucie Leather, M.D.  [facility]  Medical Director [facility] Medical Director [facility] Cardio-Pulmonary Department

## 2019-01-16 ENCOUNTER — Ambulatory Visit
Admission: RE | Admit: 2019-01-16 | Discharge: 2019-01-16 | Disposition: A | Discharge status: Home / Self Care | Payer: BLUE CROSS/BLUE SHIELD | Source: Ambulatory Visit | Attending: Surgery | Admitting: Surgery

## 2019-01-16 ENCOUNTER — Other Ambulatory Visit: Payer: Self-pay

## 2019-01-16 DIAGNOSIS — Z8781 Personal history of (healed) traumatic fracture: Secondary | ICD-10-CM | POA: Insufficient documentation

## 2019-01-16 DIAGNOSIS — Z9889 Other specified postprocedural states: Secondary | ICD-10-CM | POA: Diagnosis present

## 2019-01-31 ENCOUNTER — Encounter: Payer: Self-pay | Admitting: Dietician

## 2019-01-31 ENCOUNTER — Other Ambulatory Visit: Payer: Self-pay

## 2019-01-31 ENCOUNTER — Encounter: Payer: BLUE CROSS/BLUE SHIELD | Attending: Internal Medicine | Admitting: Dietician

## 2019-01-31 VITALS — Ht 71.0 in | Wt 278.0 lb

## 2019-01-31 DIAGNOSIS — Z6838 Body mass index (BMI) 38.0-38.9, adult: Secondary | ICD-10-CM | POA: Diagnosis not present

## 2019-01-31 DIAGNOSIS — E6609 Other obesity due to excess calories: Secondary | ICD-10-CM | POA: Diagnosis not present

## 2019-01-31 DIAGNOSIS — E785 Hyperlipidemia, unspecified: Secondary | ICD-10-CM

## 2019-01-31 NOTE — Patient Instructions (Signed)
   Reduce portions of meats, especially higher fat types such as steaks, burgers, etc. Ideally aim for palm-size portions.   Increase vegetables and fruits with meals and/or for snacks.   Check fat content of coffee creamer, consider choosing a light or fat free version, or try milk or fat free 1/2 and 1/2.   Try tracking intake using app such as MyFitnessPal to gauge progress; aim for about 1800 calorie daily average.

## 2019-01-31 NOTE — Progress Notes (Signed)
Medical Nutrition Therapy: Visit start time: 1500  end time: 1600  Assessment:  Diagnosis: hyperlipidemia, weight gain Past medical history: GERD Psychosocial issues/ stress concerns: none  Preferred learning method:  . Auditory . Visual . Hands-on   Current weight: 278.0lbs with shoes   Height: 5'11" Medications, supplements: reconciled list in medical record  Progress and evaluation:  Patient reports using [employer] for men program, and was able to lose weight, but re-gained wthen he stopped the program.   He reports his weight has been gradually increasing over the past several years, more rapidly since ankle injury about 1 year ago.   Cholesterol has gradually increased along with weight.   Patient reports an aversion to seafood.  Physical activity: was going to gym 2-3 times a week, but unable to recently due to covid restrictions and back/ ankle pain  Dietary Intake:  Usual eating pattern includes 3 meals and 0-1 snacks per day. Dining out frequency: 8-11 meals per week.  Breakfast: frozen small chicken biscuits (2) + 1c coffee with creamer Snack: several cups of coffee during the day, with creamer Lunch: usually takeout sandwich [employer] or [employer]; HC power bowl recently Snack: none Supper: home or takeout -- usually very hungry; 3/29 steak, mac and cheese, salad; usually protein + starch + veg Snack: rarely wants a sweet and will eat a brownie (pre-made) Beverages: coffee, water, diet coke; rarely orange juice  Nutrition Care Education: Topics covered: weight control, hyperlipidemia Basic nutrition: basic food groups, appropriate nutrient balance, appropriate meal and snack schedule, general nutrition guidelines    Weight control: benefits of weight control, energy needs for weight loss determined at about 1800kcal daily; provided plan with 50%CHO, 20% protein, and 30% fat; appropriate food portions; discussed importance of low fat and low sugar food  choices; using low-carb vegetables and fruits, high fiber foods to promote satiety; basic meal planning using plate method; benefits of tracking food intake and options for doing so. Hyperlipidemia:  target goals for lipids, healthy and unhealthy fats, role of fiber and food sources of fiber; non-food factors that influence cholesterol including exercise and stress   Nutritional Diagnosis:  [place]-2.2 Altered nutrition-related laboratory As related to hyperlipidemia.  As evidenced by elevated LDL and low HDL. [place]-3.3 Overweight/obesity As related to excess calories and limited activity.  As evidenced by patient with current BMI of 38.77.  Intervention:   Instruction and discussion as noted above.  Patient has begun making some diet changes to lose weight and lower cholesterol  Established goals with direction from patient.   Education Materials given:  . General diet guidelines for Cholesterol-lowering/ Heart health . Plate Planner with food lists . Sample meal pattern/ menus . Goals/ instructions   Learner/ who was taught:  . Patient   Level of understanding: Marland Kitchen Verbalizes/ demonstrates competency   Demonstrated degree of understanding via:   Teach back Learning barriers: . None  Willingness to learn/ readiness for change: . Eager, change in progress  Monitoring and Evaluation:  Dietary intake, exercise, blood lipids, and body weight      follow up: 02/28/19

## 2019-02-10 ENCOUNTER — Ambulatory Visit (INDEPENDENT_AMBULATORY_CARE_PROVIDER_SITE_OTHER): Payer: BLUE CROSS/BLUE SHIELD | Admitting: Internal Medicine

## 2019-02-10 ENCOUNTER — Encounter: Payer: Self-pay | Admitting: Internal Medicine

## 2019-02-10 DIAGNOSIS — K219 Gastro-esophageal reflux disease without esophagitis: Secondary | ICD-10-CM | POA: Diagnosis not present

## 2019-02-10 DIAGNOSIS — G4719 Other hypersomnia: Secondary | ICD-10-CM

## 2019-02-10 NOTE — Patient Instructions (Signed)
Continue Protonix 40 mg daily for the next 2 weeks then stop If symptoms persist, will refer patient to GI consultation   Excessive daytime sleepiness and signs and symptoms of sleep apnea Patient will need sleep study for definitive diagnosis Home sleep study ordered  Recommend watching diet and increase exercise and weight loss

## 2019-02-10 NOTE — Progress Notes (Signed)
VIDEO/TELEPHONE VISIT    In the setting of the current Covid19 crisis, you are scheduled for a (video) visit with me 02/10/2019  Just as we do with many in-office visits, in order for you to participate in this visit, we must obtain consent.   I can obtain your verbal consent now.  PATIENT AGREES AND CONFIRMS -YES This Visit has Audio and Visual Capabilities for optimal patient care experience   Evaluation Performed:  Follow-up visit  This visit type was conducted due to national recommendations for restrictions regarding the COVID-19 Pandemic (e.g. social distancing).  This format is felt to be most appropriate for this patient at this time.  All issues noted in this document were discussed and addressed.  No physical exam was performed (except for noted visual exam findings with Telehealth visits).  See MyChart message from today for the patient's consent to telehealth for [facility]     Date:  02/10/2019   ID:  Linzie Collin, DOB 10-12-1986, MRN [id number]  Patient Location:  [address] [place] [address]   Provider location:   [facility]  PCP:  Jamelle Haring, MD   Chief Complaint:   Follow up GERD and reactive airways disease +excessive daytime sleepiness  History of Present Illness:    Mykol Goytia is a 33 y.o. male who presents via audio/video conferencing for a telehealth visit today.   The patient does not symptoms concerning for COVID-19 infection (fever, chills, cough, or new SHORTNESS OF BREATH).   +GERD has improved since starting protonix patient feels much better He is noT using albuterol Occasional ETOH use and has reaction to beer   No evidence of infection    Patient has issues with sleep related to excessive daytime sleepiness Patient  has been having sleep problems for many years Patient has been having excessive daytime sleepiness for a long time Patient has been having extreme fatigue and  tiredness, lack of energy +  very Loud snoring every night +nonrefreshed sleep  Discussed sleep data and reviewed with patient.  Encouraged proper weight management.  Discussed driving precautions and its relationship with hypersomnolence.  Discussed operating dangerous equipment and its relationship with hypersomnolence.  Discussed sleep hygiene, and benefits of a fixed sleep waked time.  The importance of getting eight or more hours of sleep discussed with patient.  Discussed limiting the use of the computer and television before bedtime.  Decrease naps during the day, so night time sleep will become enhanced.  Limit caffeine, and sleep deprivation.  HTN, stroke, and heart failure are potential risk factors.    EPWORTH SLEEP SCORE 14  Patient weighs 270 pounds 18.5 inch neck size    Current Outpatient Medications on File Prior to Visit  Medication Sig Dispense Refill  . albuterol (PROVENTIL HFA;VENTOLIN HFA) 108 (90 Base) MCG/ACT inhaler Inhale 2 puffs into the lungs every 6 (six) hours as needed for wheezing or shortness of breath. 1 Inhaler 2  . ibuprofen (ADVIL,MOTRIN) 200 MG tablet Take 200 mg by mouth every 6 (six) hours as needed.    . Loratadine (CLARITIN PO) Take by mouth.    . pantoprazole (PROTONIX) 40 MG tablet Take 1 tablet (40 mg total) by mouth daily. 30 tablet 1   No current facility-administered medications on file prior to visit.      Allergies:   Patient has no known allergies.   Social History   Tobacco Use  . Smoking status: Never Smoker  .  Smokeless tobacco: Never Used  Substance Use Topics  . Alcohol use: Yes    Comment: occasionally  . Drug use: Not on file      Review of Systems:  Gen:  Denies  fever, sweats, chills weigh loss  +excessive daytime sleepiness HEENT: Denies blurred vision, double vision, ear pain, eye pain, hearing loss, nose bleeds, sore throat Cardiac:  No dizziness, chest pain or heaviness, chest tightness,edema, No  JVD Resp:   No cough, -sputum production, -shortness of breath,-wheezing, -hemoptysis,  Gi: Denies swallowing difficulty, stomach pain, nausea or vomiting, diarrhea, constipation, bowel incontinence +GERD Gu:  Denies bladder incontinence, burning urine Ext:   Denies Joint pain, stiffness or swelling Skin: Denies  skin rash, easy bruising or bleeding or hives Endoc:  Denies polyuria, polydipsia , polyphagia or weight change Psych:   Denies depression, insomnia or hallucinations  Other:  All other systems negative         ASSESSMENT & PLAN:    33 year old morbidly obese white male with signs of symptoms of GERD Previous office visit I prescribed Protonix 40 mg daily which has significantly have not helped his symptoms Patient also has signs and symptoms of excessive daytime sleepiness along with loud snoring which is highly suggestive of underlying sleep apnea   GERD Continue Protonix 40 mg daily for the next 2 weeks If symptoms persist patient will need GI consultation   Excessive daytime sleepiness with Epworth sleep score of 14 Patient needs home sleep study to assess for underlying sleep apnea  Obesity -recommend significant weight loss -recommend changing diet Follow-up nutritional consultation  Deconditioned state -Recommend increased daily activity and exercise      COVID-19 Education: The signs and symptoms of COVID-19 were discussed with the patient and how to seek care for testing (follow up with PCP or arrange E-visit).  The importance of social distancing was discussed today.  Patient Risk:   After full review of this patients clinical status, I feel that they are at least moderate risk at this time.  Time:   Today, I have spent 25 minutes with the patient with telehealth technology discussing .     Medication Adjustments/Labs and Tests Ordered: Current medicines are reviewed at length with the patient today.  Concerns regarding medicines are outlined  above.   Tests Ordered: Home SLeep study   Medication Changes: Protonix for 2 more weeks   Disposition: Follow-up in 1 month once home sleep study completed   Patient satisfied with Plan of action and management. All questions answered  Lucie LeatherKurian David Windel Keziah, M.D.  [facility]  Medical Director [facility] Medical Director [facility]     [facility] [address], [place], [place] [address]

## 2019-02-14 ENCOUNTER — Other Ambulatory Visit: Payer: Self-pay | Admitting: Internal Medicine

## 2019-02-14 DIAGNOSIS — K219 Gastro-esophageal reflux disease without esophagitis: Secondary | ICD-10-CM

## 2019-02-28 ENCOUNTER — Ambulatory Visit: Payer: BLUE CROSS/BLUE SHIELD | Admitting: Dietician

## 2019-03-03 ENCOUNTER — Encounter: Payer: BLUE CROSS/BLUE SHIELD | Attending: Internal Medicine | Admitting: Dietician

## 2019-03-03 ENCOUNTER — Other Ambulatory Visit: Payer: Self-pay

## 2019-03-03 ENCOUNTER — Encounter: Payer: Self-pay | Admitting: Dietician

## 2019-03-03 VITALS — Ht 71.0 in | Wt 269.0 lb

## 2019-03-03 DIAGNOSIS — Z6837 Body mass index (BMI) 37.0-37.9, adult: Secondary | ICD-10-CM

## 2019-03-03 DIAGNOSIS — E785 Hyperlipidemia, unspecified: Secondary | ICD-10-CM | POA: Diagnosis not present

## 2019-03-03 DIAGNOSIS — E6609 Other obesity due to excess calories: Secondary | ICD-10-CM | POA: Diagnosis not present

## 2019-03-03 NOTE — Progress Notes (Signed)
Medical Nutrition Therapy: Visit start time: 0900  end time: 0930  Assessment:  Diagnosis: obesity, hyperlipidemia Medical history changes: no changes Psychosocial issues/ stress concerns: none  Current weight: 269.0lbs  Height: 5'11" Medications, supplement changes: no changes   Progress and evaluation:   Patient states he has been more deliberate with food portions, mainly reducing meat portions and working on controlling calories.   Has resumed some exercise as tolerated due to ankle and back pain  Tracked calorie intake using app for a short time, now following consistent pattern, close to 1800kcal goal.  Physical activity: 30-45 minutes 3-4 times a week  Dietary Intake:  Usual eating pattern includes 3 meals and 0-1 snacks per day. Dining out frequency: 3-4 meals per week.  Breakfast: low-cal breakfast sandwich + coffee with 1 Tbsp creamer Snack: none Lunch: healthy choice meal + fruit Snack: occasional 1 Tbsp peanut butter if hungry Supper: smaller meat portions + veg + starch Snack: usually none; recently small portion brownie Beverages: water (has increased), coffee, diet coke  Nutrition Care Education: Topics covered: weight control Basic nutrition: appropriate nutrient balance    Weight control: reviewed importance of ongoing low sugar and low fat food choices; including variety of food choices; avoiding fatigue with current meal pattern and choices   Nutritional Diagnosis:  [place]-2.2 Altered nutrition-related laboratory As related to hyperlipidemia.  As evidenced by low HDL and elevated LDL. [place]-3.3 Overweight/obesity As related to history of excess calories and inactivity due to injury.  As evidenced by patient with current BMI of 37.52, following dietary pattern for ongoing weight loss.  Intervention:   Instruction and discussion as noted above.  Commended patient for positive changes made.   Patient is motivated to continue with healthy food choices and  controlled caloric intake.   Goal is to continue with current eating pattern and exercise.  Education Materials given:  Marland Kitchen Goals/ instructions   Learner/ who was taught:  . Patient   Level of understanding: Marland Kitchen Verbalizes/ demonstrates competency   Demonstrated degree of understanding via:   Teach back Learning barriers: . None  Willingness to learn/ readiness for change: . Eager, change in progress  Monitoring and Evaluation:  Dietary intake, exercise, blood lipids, and body weight      follow up: 04/28/19

## 2019-03-03 NOTE — Patient Instructions (Signed)
   Continue with current eating pattern and regular exercise, great job! Your efforts are definitely paying off!  Gradually consider some alternative options for meals if you tire of current choices.

## 2019-03-18 ENCOUNTER — Ambulatory Visit: Payer: BLUE CROSS/BLUE SHIELD

## 2019-03-18 ENCOUNTER — Other Ambulatory Visit: Payer: Self-pay

## 2019-03-18 DIAGNOSIS — G4719 Other hypersomnia: Secondary | ICD-10-CM

## 2019-03-23 ENCOUNTER — Telehealth: Payer: Self-pay | Admitting: Internal Medicine

## 2019-03-23 DIAGNOSIS — G4733 Obstructive sleep apnea (adult) (pediatric): Secondary | ICD-10-CM

## 2019-03-23 NOTE — Telephone Encounter (Signed)
HST performed on 03/18/2019 confirmed mild OSA with AHI of 11. Recommend auto cpap 5-20cm h2O.  Pt is aware of results and voiced his understanding.  Pt wished to proceed with cpap.  Order has been placed. Pt has been scheduled for f/u on 05/26/19 at 11:00p. Nothing further is needed.

## 2019-04-20 ENCOUNTER — Telehealth: Payer: Self-pay | Admitting: Dietician

## 2019-04-20 NOTE — Telephone Encounter (Signed)
Spoke with patient by phone after receiving message to cancel his appointment for 04/28/19; he will be having surgery on 04/27/19 followed by recovery that could take several weeks. Adrian Lang will call when he is recovered enough to return for MNT, and reschedule the appointment then.

## 2019-04-22 ENCOUNTER — Other Ambulatory Visit: Payer: Self-pay

## 2019-04-22 ENCOUNTER — Other Ambulatory Visit
Admission: RE | Admit: 2019-04-22 | Discharge: 2019-04-22 | Disposition: A | Discharge status: Home / Self Care | Payer: BC Managed Care – PPO | Source: Ambulatory Visit | Attending: Surgery | Admitting: Surgery

## 2019-04-22 ENCOUNTER — Encounter
Admission: RE | Admit: 2019-04-22 | Discharge: 2019-04-22 | Disposition: A | Discharge status: Home / Self Care | Payer: BC Managed Care – PPO | Source: Ambulatory Visit | Attending: Surgery | Admitting: Surgery

## 2019-04-22 DIAGNOSIS — Z1159 Encounter for screening for other viral diseases: Secondary | ICD-10-CM | POA: Insufficient documentation

## 2019-04-22 HISTORY — DX: Gastro-esophageal reflux disease without esophagitis: K21.9

## 2019-04-22 HISTORY — DX: Sleep apnea, unspecified: G47.30

## 2019-04-22 NOTE — Patient Instructions (Addendum)
  Your procedure is scheduled on: Wednesday April 27, 2019 Report to Same Day Surgery 2nd floor [facility] Entrance-take elevator on left to 2nd floor.  Check in with surgery information desk.) To find out your arrival time, call [phone] 1:00-3:00 PM on Tuesday April 26, 2019  Remember: Instructions that are not followed completely may result in serious medical risk, up to and including death, or upon the discretion of your surgeon and anesthesiologist your surgery may need to be rescheduled.    __x__ 1. Do not eat food (including mints, candies, chewing gum) after midnight the night before your procedure. You may drink clear liquids up to 2 hours before you are scheduled to arrive at the hospital for your procedure.  Do not drink anything within 2 hours of your scheduled arrival to the hospital.  Approved clear liquids:  --Water or Apple juice without pulp  --Clear carbohydrate beverage such as Gatorade or Powerade  --Black Coffee or Clear Tea (No milk, no creamers, do not add anything to the coffee or tea)    __x__ 2. No Alcohol for 24 hours before or after surgery.   __x__ 3. No Smoking or e-cigarettes for 24 hours before surgery.  Do not use any chewable tobacco products for at least 6 hours before surgery.   __x__ 4. Notify your doctor if there is any change in your medical condition (cold, fever, infections).   __x__ 5. On the morning of surgery brush your teeth with toothpaste and water.  You may rinse your mouth with mouthwash if you wish.  Do not swallow any toothpaste or mouthwash.   __x__  Use antibacterial soap such as Dial to shower/bathe on the day of surgery.   Do not wear jewelry on the day of surgery.  Do not wear lotions, powders, deodorant, or perfumes.   Do not shave below the face/neck 48 hours prior to surgery.   Do not bring valuables to the hospital.    [facility] is not responsible for any belongings or valuables.               For patients  admitted to the hospital, discharge time is determined by your treatment team.  For patients discharged on the day of surgery, you will NOT be permitted to drive yourself home.  You must have a responsible adult with you for 24 hours after surgery.  __x__ Take these medicines on the morning of surgery with a SMALL SIP OF WATER:  1. Protonix/Pantoprazole  ____ Use inhalers on the day of surgery and bring them with you to the hospital.  __x__ Bring C-Pap/Bi-Pap machine to the hospital.   ____ Follow recommendations from Cardiologist, Pulmonologist or PCP regarding stopping Aspirin, Coumadin, Plavix, Eliquis, Effient, Pradaxa, and Pletal.  __x__ TODAY: Stop Anti-inflammatories such as Advil, Ibuprofen, Motrin, Aleve, Naproxen, Naprosyn, BC/Goodies powders or aspirin products. You may continue to take Tylenol and Celebrex.   __x__ TODAY: Stop over the counter supplements until after surgery.

## 2019-04-23 LAB — NOVEL CORONAVIRUS, NAA (HOSP ORDER, SEND-OUT TO REF LAB; TAT 18-24 HRS): SARS-CoV-2, NAA: NOT DETECTED

## 2019-04-26 MED ORDER — CEFAZOLIN SODIUM-DEXTROSE 2-4 GM/100ML-% IV SOLN
2.0000 g | Freq: Once | INTRAVENOUS | Status: AC
  Administered 2019-04-27: 2 g via INTRAVENOUS

## 2019-04-27 ENCOUNTER — Encounter: Payer: Self-pay | Admitting: *Deleted

## 2019-04-27 ENCOUNTER — Ambulatory Visit: Payer: BC Managed Care – PPO | Admitting: Anesthesiology

## 2019-04-27 ENCOUNTER — Encounter
Admission: RE | Disposition: A | Discharge status: Home / Self Care | Payer: Self-pay | Source: Home / Self Care | Attending: Surgery

## 2019-04-27 ENCOUNTER — Ambulatory Visit
Admission: RE | Admit: 2019-04-27 | Discharge: 2019-04-27 | Disposition: A | Discharge status: Home / Self Care | Payer: BC Managed Care – PPO | Attending: Surgery | Admitting: Surgery

## 2019-04-27 ENCOUNTER — Other Ambulatory Visit: Payer: Self-pay

## 2019-04-27 DIAGNOSIS — X58XXXA Exposure to other specified factors, initial encounter: Secondary | ICD-10-CM | POA: Insufficient documentation

## 2019-04-27 DIAGNOSIS — T8484XA Pain due to internal orthopedic prosthetic devices, implants and grafts, initial encounter: Secondary | ICD-10-CM | POA: Diagnosis present

## 2019-04-27 DIAGNOSIS — G4730 Sleep apnea, unspecified: Secondary | ICD-10-CM | POA: Diagnosis not present

## 2019-04-27 DIAGNOSIS — M958 Other specified acquired deformities of musculoskeletal system: Secondary | ICD-10-CM | POA: Insufficient documentation

## 2019-04-27 DIAGNOSIS — Z79899 Other long term (current) drug therapy: Secondary | ICD-10-CM | POA: Diagnosis not present

## 2019-04-27 DIAGNOSIS — M93272 Osteochondritis dissecans, left ankle and joints of left foot: Secondary | ICD-10-CM | POA: Insufficient documentation

## 2019-04-27 DIAGNOSIS — K219 Gastro-esophageal reflux disease without esophagitis: Secondary | ICD-10-CM | POA: Insufficient documentation

## 2019-04-27 DIAGNOSIS — M19072 Primary osteoarthritis, left ankle and foot: Secondary | ICD-10-CM | POA: Diagnosis not present

## 2019-04-27 HISTORY — PX: ANKLE ARTHROSCOPY: SHX545

## 2019-04-27 HISTORY — PX: HARDWARE REMOVAL: SHX979

## 2019-04-27 SURGERY — ARTHROSCOPY, ANKLE
Anesthesia: General | Site: Ankle | Laterality: Left

## 2019-04-27 MED ORDER — DEXAMETHASONE SODIUM PHOSPHATE 10 MG/ML IJ SOLN
INTRAMUSCULAR | Status: AC
  Filled 2019-04-27: qty 1

## 2019-04-27 MED ORDER — LIDOCAINE HCL (PF) 2 % IJ SOLN
INTRAMUSCULAR | Status: AC
  Filled 2019-04-27: qty 10

## 2019-04-27 MED ORDER — ONDANSETRON HCL 4 MG/2ML IJ SOLN
INTRAMUSCULAR | Status: DC | PRN
  Administered 2019-04-27: 4 mg via INTRAVENOUS

## 2019-04-27 MED ORDER — SODIUM CHLORIDE FLUSH 0.9 % IV SOLN
INTRAVENOUS | Status: AC
  Filled 2019-04-27: qty 10

## 2019-04-27 MED ORDER — BUPIVACAINE HCL (PF) 0.5 % IJ SOLN
INTRAMUSCULAR | Status: AC
  Filled 2019-04-27: qty 30

## 2019-04-27 MED ORDER — METOCLOPRAMIDE HCL 10 MG PO TABS: 5.0000 mg | ORAL_TABLET | Freq: Three times a day (TID) | ORAL | Status: DC | PRN

## 2019-04-27 MED ORDER — OXYCODONE HCL 5 MG PO TABS: 5.0000 mg | ORAL_TABLET | ORAL | 0 refills | Status: DC | PRN

## 2019-04-27 MED ORDER — ROPIVACAINE HCL 5 MG/ML IJ SOLN
INTRAMUSCULAR | Status: DC | PRN
  Administered 2019-04-27: 5 mL via EPIDURAL
  Administered 2019-04-27: 10 mL via PERINEURAL

## 2019-04-27 MED ORDER — FENTANYL CITRATE (PF) 100 MCG/2ML IJ SOLN
INTRAMUSCULAR | Status: DC | PRN
  Administered 2019-04-27: 25 ug via INTRAVENOUS
  Administered 2019-04-27 (×3): 50 ug via INTRAVENOUS
  Administered 2019-04-27: 25 ug via INTRAVENOUS

## 2019-04-27 MED ORDER — PROMETHAZINE HCL 25 MG/ML IJ SOLN
6.2500 mg | INTRAMUSCULAR | Status: DC | PRN
  Administered 2019-04-27: 6.25 mg via INTRAVENOUS

## 2019-04-27 MED ORDER — ONDANSETRON HCL 4 MG/2ML IJ SOLN: 4.0000 mg | Freq: Four times a day (QID) | INTRAMUSCULAR | Status: DC | PRN

## 2019-04-27 MED ORDER — MIDAZOLAM HCL 2 MG/2ML IJ SOLN
INTRAMUSCULAR | Status: AC
  Filled 2019-04-27: qty 2

## 2019-04-27 MED ORDER — PROPOFOL 10 MG/ML IV BOLUS
INTRAVENOUS | Status: AC
  Filled 2019-04-27: qty 60

## 2019-04-27 MED ORDER — BUPIVACAINE-EPINEPHRINE (PF) 0.5% -1:200000 IJ SOLN
INTRAMUSCULAR | Status: AC
  Filled 2019-04-27: qty 30

## 2019-04-27 MED ORDER — FENTANYL CITRATE (PF) 100 MCG/2ML IJ SOLN
INTRAMUSCULAR | Status: AC
  Filled 2019-04-27: qty 2

## 2019-04-27 MED ORDER — ONDANSETRON HCL 4 MG/2ML IJ SOLN
INTRAMUSCULAR | Status: AC
  Filled 2019-04-27: qty 2

## 2019-04-27 MED ORDER — LIDOCAINE HCL (CARDIAC) PF 100 MG/5ML IV SOSY
PREFILLED_SYRINGE | INTRAVENOUS | Status: DC | PRN
  Administered 2019-04-27: 100 mg via INTRAVENOUS

## 2019-04-27 MED ORDER — BUPIVACAINE HCL (PF) 0.5 % IJ SOLN
INTRAMUSCULAR | Status: DC | PRN
  Administered 2019-04-27 (×2): 15 mL

## 2019-04-27 MED ORDER — DEXAMETHASONE SODIUM PHOSPHATE 10 MG/ML IJ SOLN
INTRAMUSCULAR | Status: DC | PRN
  Administered 2019-04-27: 5 mg via INTRAVENOUS

## 2019-04-27 MED ORDER — LIDOCAINE HCL (PF) 1 % IJ SOLN
INTRAMUSCULAR | Status: DC | PRN
  Administered 2019-04-27: 5 mL

## 2019-04-27 MED ORDER — OXYCODONE HCL 5 MG PO TABS
ORAL_TABLET | ORAL | Status: AC
  Filled 2019-04-27: qty 1

## 2019-04-27 MED ORDER — MEPERIDINE HCL 50 MG/ML IJ SOLN: 6.2500 mg | INTRAMUSCULAR | Status: DC | PRN

## 2019-04-27 MED ORDER — LIDOCAINE HCL (PF) 1 % IJ SOLN
INTRAMUSCULAR | Status: AC
  Filled 2019-04-27: qty 5

## 2019-04-27 MED ORDER — OXYCODONE HCL 5 MG/5ML PO SOLN: 5.0000 mg | Freq: Once | ORAL | Status: DC | PRN

## 2019-04-27 MED ORDER — LACTATED RINGERS IV SOLN
INTRAVENOUS | Status: DC
  Administered 2019-04-27: 75 mL/h via INTRAVENOUS
  Administered 2019-04-27: 08:00:00 via INTRAVENOUS

## 2019-04-27 MED ORDER — OXYCODONE HCL 5 MG PO TABS: 5.0000 mg | ORAL_TABLET | Freq: Once | ORAL | Status: DC | PRN

## 2019-04-27 MED ORDER — FENTANYL CITRATE (PF) 100 MCG/2ML IJ SOLN
25.0000 ug | INTRAMUSCULAR | Status: DC | PRN
  Administered 2019-04-27: 50 ug via INTRAVENOUS
  Administered 2019-04-27: 25 ug via INTRAVENOUS

## 2019-04-27 MED ORDER — POTASSIUM CHLORIDE IN NACL 20-0.9 MEQ/L-% IV SOLN: INTRAVENOUS | Status: DC

## 2019-04-27 MED ORDER — CEFAZOLIN SODIUM-DEXTROSE 2-4 GM/100ML-% IV SOLN
INTRAVENOUS | Status: AC
  Filled 2019-04-27: qty 100

## 2019-04-27 MED ORDER — OXYCODONE HCL 5 MG PO TABS
5.0000 mg | ORAL_TABLET | ORAL | Status: DC | PRN
  Filled 2019-04-27: qty 2

## 2019-04-27 MED ORDER — ACETAMINOPHEN 10 MG/ML IV SOLN
INTRAVENOUS | Status: DC | PRN
  Administered 2019-04-27: 1000 mg via INTRAVENOUS

## 2019-04-27 MED ORDER — FAMOTIDINE 20 MG PO TABS
ORAL_TABLET | ORAL | Status: AC
  Administered 2019-04-27: 20 mg via ORAL
  Filled 2019-04-27: qty 1

## 2019-04-27 MED ORDER — ROPIVACAINE HCL 5 MG/ML IJ SOLN
INTRAMUSCULAR | Status: AC
  Filled 2019-04-27: qty 60

## 2019-04-27 MED ORDER — DEXMEDETOMIDINE HCL IN NACL 200 MCG/50ML IV SOLN
INTRAVENOUS | Status: DC | PRN
  Administered 2019-04-27 (×5): 4 ug via INTRAVENOUS

## 2019-04-27 MED ORDER — MORPHINE SULFATE (PF) 10 MG/ML IV SOLN
INTRAVENOUS | Status: AC
  Filled 2019-04-27: qty 1

## 2019-04-27 MED ORDER — BUPIVACAINE-EPINEPHRINE (PF) 0.5% -1:200000 IJ SOLN
INTRAMUSCULAR | Status: DC | PRN
  Administered 2019-04-27: 15 mL via PERINEURAL

## 2019-04-27 MED ORDER — ACETAMINOPHEN 10 MG/ML IV SOLN
INTRAVENOUS | Status: AC
  Filled 2019-04-27: qty 100

## 2019-04-27 MED ORDER — FAMOTIDINE 20 MG PO TABS
20.0000 mg | ORAL_TABLET | Freq: Once | ORAL | Status: AC
  Administered 2019-04-27: 20 mg via ORAL

## 2019-04-27 MED ORDER — PROMETHAZINE HCL 25 MG/ML IJ SOLN
INTRAMUSCULAR | Status: AC
  Filled 2019-04-27: qty 1

## 2019-04-27 MED ORDER — MIDAZOLAM HCL 2 MG/2ML IJ SOLN
INTRAMUSCULAR | Status: DC | PRN
  Administered 2019-04-27: 2 mg via INTRAVENOUS

## 2019-04-27 MED ORDER — METOCLOPRAMIDE HCL 5 MG/ML IJ SOLN: 5.0000 mg | Freq: Three times a day (TID) | INTRAMUSCULAR | Status: DC | PRN

## 2019-04-27 MED ORDER — DEXMEDETOMIDINE HCL IN NACL 200 MCG/50ML IV SOLN
INTRAVENOUS | Status: AC
  Filled 2019-04-27: qty 50

## 2019-04-27 MED ORDER — PROPOFOL 10 MG/ML IV BOLUS
INTRAVENOUS | Status: AC
  Filled 2019-04-27: qty 20

## 2019-04-27 MED ORDER — PROPOFOL 10 MG/ML IV BOLUS
INTRAVENOUS | Status: DC | PRN
  Administered 2019-04-27: 30 mg via INTRAVENOUS
  Administered 2019-04-27: 200 mg via INTRAVENOUS
  Administered 2019-04-27: 20 mg via INTRAVENOUS

## 2019-04-27 MED ORDER — ONDANSETRON HCL 4 MG PO TABS: 4.0000 mg | ORAL_TABLET | Freq: Four times a day (QID) | ORAL | Status: DC | PRN

## 2019-04-27 MED ORDER — OXYCODONE HCL 5 MG PO TABS
5.0000 mg | ORAL_TABLET | ORAL | Status: DC | PRN
  Administered 2019-04-27: 5 mg via ORAL
  Filled 2019-04-27: qty 2

## 2019-04-27 SURGICAL SUPPLY — 62 items
ADAPTER IRRIG TUBE 2 SPIKE SOL (ADAPTER) ×3 IMPLANT
BANDAGE ACE 4X5 VEL STRL LF (GAUZE/BANDAGES/DRESSINGS) ×3 IMPLANT
BANDAGE ELASTIC 4 LF NS (GAUZE/BANDAGES/DRESSINGS) ×6 IMPLANT
BLADE FULL RADIUS 2.9 (BLADE) ×3 IMPLANT
BNDG COHESIVE 4X5 TAN STRL (GAUZE/BANDAGES/DRESSINGS) ×3 IMPLANT
BNDG ESMARK 6X12 TAN STRL LF (GAUZE/BANDAGES/DRESSINGS) ×3 IMPLANT
CANISTER SUCT 1200ML W/VALVE (MISCELLANEOUS) ×3 IMPLANT
CASTING MATERIAL DELTA LITE (CAST SUPPLIES) ×3 IMPLANT
CHLORAPREP W/TINT 26 (MISCELLANEOUS) ×6 IMPLANT
CLOSURE WOUND 1/2 X4 (GAUZE/BANDAGES/DRESSINGS) ×1
COVER WAND RF STERILE (DRAPES) ×3 IMPLANT
CUFF TOURN SGL QUICK 18X4 (TOURNIQUET CUFF) IMPLANT
CUFF TOURN SGL QUICK 24 (TOURNIQUET CUFF)
CUFF TOURN SGL QUICK 30 (TOURNIQUET CUFF)
CUFF TRNQT CYL 24X4X16.5-23 (TOURNIQUET CUFF) IMPLANT
CUFF TRNQT CYL 30X4X21-28X (TOURNIQUET CUFF) IMPLANT
DRAPE FLUOR MINI C-ARM 54X84 (DRAPES) ×3 IMPLANT
DRAPE INCISE IOBAN 66X45 STRL (DRAPES) ×3 IMPLANT
ELECT CAUTERY BLADE 6.4 (BLADE) ×3 IMPLANT
ELECT REM PT RETURN 9FT ADLT (ELECTROSURGICAL) ×3
ELECTRODE REM PT RTRN 9FT ADLT (ELECTROSURGICAL) ×1 IMPLANT
GAUZE SPONGE 4X4 12PLY STRL (GAUZE/BANDAGES/DRESSINGS) ×3 IMPLANT
GAUZE XEROFORM 1X8 LF (GAUZE/BANDAGES/DRESSINGS) ×3 IMPLANT
GLOVE BIO SURGEON STRL SZ8 (GLOVE) ×6 IMPLANT
GLOVE INDICATOR 8.0 STRL GRN (GLOVE) ×3 IMPLANT
GOWN STRL REUS W/ TWL LRG LVL3 (GOWN DISPOSABLE) ×2 IMPLANT
GOWN STRL REUS W/ TWL XL LVL3 (GOWN DISPOSABLE) ×1 IMPLANT
GOWN STRL REUS W/TWL LRG LVL3 (GOWN DISPOSABLE) ×4
GOWN STRL REUS W/TWL XL LVL3 (GOWN DISPOSABLE) ×2
IV LACTATED RINGER IRRG 3000ML (IV SOLUTION) ×6
IV LR IRRIG 3000ML ARTHROMATIC (IV SOLUTION) ×3 IMPLANT
KIT TURNOVER KIT A (KITS) ×3 IMPLANT
LABEL OR SOLS (LABEL) ×3 IMPLANT
MANIFOLD NEPTUNE II (INSTRUMENTS) ×3 IMPLANT
NEEDLE FILTER BLUNT 18X 1/2SAF (NEEDLE) ×2
NEEDLE FILTER BLUNT 18X1 1/2 (NEEDLE) ×1 IMPLANT
NS IRRIG 1000ML POUR BTL (IV SOLUTION) ×3 IMPLANT
NS IRRIG 500ML POUR BTL (IV SOLUTION) ×3 IMPLANT
PACK ARTHROSCOPY KNEE (MISCELLANEOUS) ×3 IMPLANT
PACK EXTREMITY ARMC (MISCELLANEOUS) ×3 IMPLANT
PAD CAST CTTN 4X4 STRL (SOFTGOODS) ×3 IMPLANT
PADDING CAST COTTON 4X4 STRL (SOFTGOODS) ×6
STAPLER SKIN PROX 35W (STAPLE) ×3 IMPLANT
STOCKINETTE M/LG 89821 (MISCELLANEOUS) ×3 IMPLANT
STRAP ANKLE FOOT DISTRACTOR (ORTHOPEDIC SUPPLIES) ×3 IMPLANT
STRAP SAFETY 5IN WIDE (MISCELLANEOUS) ×3 IMPLANT
STRIP CLOSURE SKIN 1/2X4 (GAUZE/BANDAGES/DRESSINGS) ×2 IMPLANT
SUCTION FRAZIER HANDLE 10FR (MISCELLANEOUS) ×2
SUCTION TUBE FRAZIER 10FR DISP (MISCELLANEOUS) ×1 IMPLANT
SUT ETHIBOND GREEN BRAID 0S 4 (SUTURE) ×3 IMPLANT
SUT ETHIBOND NAB CT1 #1 30IN (SUTURE) ×3 IMPLANT
SUT ORTHOCORD 2X36 W/O NDL (SUTURE) ×3 IMPLANT
SUT ORTHOCORD W/MULTIPK NDL (SUTURE) ×3 IMPLANT
SUT PROLENE 4 0 PS 2 18 (SUTURE) ×6 IMPLANT
SUT VIC AB 2-0 CT2 27 (SUTURE) ×3 IMPLANT
SUT VIC AB 2-0 SH 27 (SUTURE) ×4
SUT VIC AB 2-0 SH 27XBRD (SUTURE) ×2 IMPLANT
SUT VIC AB 3-0 SH 27 (SUTURE) ×2
SUT VIC AB 3-0 SH 27X BRD (SUTURE) ×1 IMPLANT
SYR 10ML LL (SYRINGE) ×3 IMPLANT
SYR BULB IRRIG 60ML STRL (SYRINGE) ×3 IMPLANT
TUBING ARTHRO INFLOW-ONLY STRL (TUBING) ×3 IMPLANT

## 2019-04-27 NOTE — Anesthesia Procedure Notes (Signed)
Anesthesia Regional Block: Adductor canal block   Pre-Anesthetic Checklist: ,, timeout performed, Correct Patient, Correct Site, Correct Laterality, Correct Procedure, Correct Position, site marked, Risks and benefits discussed,  Surgical consent,  Pre-op evaluation,  At surgeon's request and post-op pain management  Laterality: Left  Prep: chloraprep       Needles:  Injection technique: Single-shot  Needle Type: Stimiplex     Needle Length: 9cm  Needle Gauge: 21     Additional Needles:   Procedures:,,,, ultrasound used (permanent image in chart),,,,  Narrative:  Start time: 04/27/2019 10:58 AM End time: 04/27/2019 11:04 AM Injection made incrementally with aspirations every 5 mL.  Performed by: Personally   Additional Notes: Functioning IV was confirmed and monitors were applied.  A Stimuplex needle was used. Sterile prep and drape,hand hygiene and sterile gloves were used.  Negative aspiration and negative test dose prior to incremental administration of local anesthetic. The patient tolerated the procedure well.

## 2019-04-27 NOTE — Transfer of Care (Signed)
Immediate Anesthesia Transfer of Care Note  Patient: Bailey Kolbe  Procedure(s) Performed: ANKLE ARTHROSCOPY LEFT, WITH DEBRIDEMENT AND EXCISION OF OSTEOPHYTES (Left Ankle) HARDWARE REMOVAL (Left Ankle)  Patient Location: PACU  Anesthesia Type:General  Level of Consciousness: sedated, drowsy and patient cooperative  Airway & Oxygen Therapy: Patient Spontanous Breathing and Patient connected to face mask oxygen  Post-op Assessment: Report given to RN and Post -op Vital signs reviewed and stable  Post vital signs: Reviewed and stable  Last Vitals:  Vitals Value Taken Time  BP 126/74 04/27/19 1015  Temp 36.8 C 04/27/19 1015  Pulse 93 04/27/19 1020  Resp 14 04/27/19 1020  SpO2 96 % 04/27/19 1020  Vitals shown include unvalidated device data.  Last Pain:  Vitals:   04/27/19 1015  TempSrc:   PainSc: 0-No pain      Patients Stated Pain Goal: 1 (64/15/83 0940)  Complications: No apparent anesthesia complications

## 2019-04-27 NOTE — Op Note (Signed)
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  Post-Op Diagnosis:   Same.  Procedure:   Arthroscopic debridement with treatment and drilling of osteochondral defect and hardware removal, left ankle.  Surgeon:   Pascal Lux, MD  Assistant:   None  Anesthesia:   General LMA  Findings:   As above.  The osteochondral defect measured approximately 5 x 7 mm and was on the medial shoulder of the midportion of the talus there were grade 1 chondromalacial changes along the anterolateral aspect of the talus as well.  The fractures were well healed.  Complications:   None  EBL:   10 cc  Fluids:   800 cc crystalloid  TT:   116 min at 250 mmHg  Drains:   None  Closure:   4-0 Prolene interrupted sutures  Brief Clinical Note:   The patient is a 33 year old male who is now nearly 2 years status post an open reduction and internal fixation of an unstable left distal fibular fracture with placement of a syndesmotic tight rope. The patient continued to have pain in his ankle despite medications, activity modification, therapy, etc., so he underwent an arthroscopic procedure to debride scar tissue of the anterior aspect of his ankle in December, 2019. Despite this procedure, he has had continued to have pain. An MRI scan has demonstrated the presence of an osteochondral defect involving the medial portion of the talus. The patient presents at this time for definitive management of his left ankle symptoms.  Procedure:   The patient was brought into the operating room and lain in the supine position. After adequate general laryngeal mask anesthesia was obtained, a timeout was performed to verify the appropriate surgical site before the ankle was injected sterilely using a solution of 15 cc of 0.5% Sensorcaine and 15 cc of 1% lidocaine with  epinephrine. The left foot and lower leg were prepped with ChloraPrep solution, then draped sterilely. Preoperative antibiotics were administered.  The limb was exsanguinated with an Esmarch and the calf tourniquet inflated to 250 mmHg.   Utilizing the previous incision, a 1 cm incision was made over the anterolateral aspect of the left ankle joint. The subcutaneous tissues were dissected bluntly down to the capsule before the capsule was penetrated with a small trocar and the small joint camera introduced. A second anteromedial portal was created similarly and the 2.9 mm full-radius resector introduced through this portal. The anteromedial aspect of the joint was inspected thoroughly with the findings as described above. The abundant reactive synovial and scar tissues anteromedially were debrided back to stable margins using the full-radius resector.  The osteochondral defect was identified by palpation.  The overlying articular cartilage had fragmented and peeled off this area.  These pieces of articular cartilage were debrided using the full-radius resector and graspers.  The subchondral bone was punctured with a small awl to enter the cyst beneath.  The loose pieces of subchondral bone were removed using graspers and the full-radius resector to decompress the cyst.  The base of the cyst was perforated several times with the arthroscopic also to be sure that good bleeding bone was encountered to complete the microfracture procedure of the talar OCD.  The camera was repositioned in the anteromedial portal and the full-radius resector introduced through the anterolateral portal. Again, the abundant reactive synovial scar tissues anterolaterally were  debrided back to stable margins using the full-radius resector. The arthroscopic equipment were removed from the joint after suctioning the excess fluid.   Attention was directed to the lateral aspect of the distal fibula in order to remove the retained fibular  hardware. The previous incision was reopened and carried down through the subcutaneous tissues to expose the plate. The zip tight anchor was identified and removed by cutting the sutures. Each of the screws then were removed using the appropriate screwdriver, including the anterior lag screw. The most proximal bicortical screw and the plate stripped. After numerous attempts were made to remove the screw utilizing the Synthes screw removal kit, the head of the screw broke off due to mechanical fatigue, permitting the plate to be removed. Rather than try to remove the remaining shaft of the screw, creating a larger defect in the fibula, it was left the elected to allow the screw fragment to remain in the fibula as it would not be likely to cause any symptoms.  Using using fluoroscopic imaging in AP and lateral projections, the location of the medial anchor of the zip tight device was identified before a 1.5 cm incision was made directly over it. The incision was carried down through the subcutaneous tissues using blunt dissection to access the medial tibial cortex. The anchor device was identified and removed. The retained sutures also were easily pulled out.  Each wound was copiously irrigated with sterile saline solution. Laterally, the subcutaneous tissues were closed in two layers using 2-0 and 3-0 Vicryl interrupted sutures before the skin was closed using staples.The portal sites and short medial incision were reapproximated using 4-0 Prolene interrupted sutures before a sterile bulky dressing was applied to the ankle. An additional 15 cc of 0.5% plain Sensorcaine was injected in and around the lateral incision to help with postoperative analgesia. The patient was then awakened, extubated, and returned to the recovery room in satisfactory condition after tolerating the procedure well.

## 2019-04-27 NOTE — Anesthesia Preprocedure Evaluation (Signed)
Anesthesia Evaluation  Patient identified by MRN, date of birth, ID band Patient awake    Reviewed: Allergy & Precautions, NPO status , Patient's Chart, lab work & pertinent test results  History of Anesthesia Complications Negative for: history of anesthetic complications  Airway Mallampati: III  TM Distance: >3 FB Neck ROM: Full    Dental no notable dental hx.    Pulmonary sleep apnea and Continuous Positive Airway Pressure Ventilation , neg COPD,    breath sounds clear to auscultation- rhonchi (-) wheezing      Cardiovascular Exercise Tolerance: Good (-) hypertension(-) CAD, (-) Past MI, (-) Cardiac Stents and (-) CABG  Rhythm:Regular Rate:Normal - Systolic murmurs and - Diastolic murmurs    Neuro/Psych neg Seizures negative neurological ROS  negative psych ROS   GI/Hepatic Neg liver ROS, GERD  ,  Endo/Other  negative endocrine ROSneg diabetes  Renal/GU negative Renal ROS     Musculoskeletal  (+) Arthritis ,   Abdominal (+) + obese,   Peds  Hematology negative hematology ROS (+)   Anesthesia Other Findings Past Medical History: No date: Arthritis     Comment:  left ankle No date: GERD (gastroesophageal reflux disease)     Comment:  can cause wheezing if gets bad No date: Sleep apnea     Comment:  uses cpap.  has mild case of sleep apnea   Reproductive/Obstetrics                             Anesthesia Physical Anesthesia Plan  ASA: II  Anesthesia Plan: General   Post-op Pain Management:  Regional for Post-op pain   Induction: Intravenous  PONV Risk Score and Plan: 1 and Ondansetron and Midazolam  Airway Management Planned: LMA  Additional Equipment:   Intra-op Plan:   Post-operative Plan:   Informed Consent: I have reviewed the patients History and Physical, chart, labs and discussed the procedure including the risks, benefits and alternatives for the proposed  anesthesia with the patient or authorized representative who has indicated his/her understanding and acceptance.     Dental advisory given  Plan Discussed with: CRNA and Anesthesiologist  Anesthesia Plan Comments:         Anesthesia Quick Evaluation

## 2019-04-27 NOTE — H&P (Signed)
Subjective:  Chief complaint: Recurrent left ankle pain.  The patient is a 33 y.o. male who is now 21 months status post an open reduction and internal fixation of unstable left distal fibular fracture.  He continued to have discomfort in his ankle despite extensive physical therapy, so he underwent an arthroscopic debridement of his left ankle in December, 2019.  Despite this procedure, he continues to have pain in the anterior aspect of his ankle which has persisted despite medications, activity modification, therapy, etc.  An MRI scan of the ankle was obtained which demonstrated the presence of an osteochondral lesion involving the medial portion of his talar dome.  The patient presents at this time for a left ankle arthroscopy with debridement, debridement and microfracturing of the talar lesion, and hardware removal of the left ankle.  Patient Active Problem List   Diagnosis Date Noted  . Ankle fracture 07/28/2017   Past Medical History:  Diagnosis Date  . Arthritis    left ankle  . GERD (gastroesophageal reflux disease)    can cause wheezing if gets bad  . Sleep apnea    uses cpap.  has mild case of sleep apnea    Past Surgical History:  Procedure Laterality Date  . ANKLE ARTHROSCOPY Left 10/06/2018   Procedure: ARTHROSCOPIC DEBRIDEMENT OF PAINFUL SCAR TISSUE FROM LEFT ANKLE RETAINED ORTHOPEDIC HARDWARE;  Surgeon: Corky Mull, MD;  Location: [place];  Service: Orthopedics;  Laterality: Left;  SMALL JOINT ARTHROSCOPY SYSTEM WITH THE 2.9 MM FULL RADIUS RESECTOR  . KNEE SURGERY Right 2012   knee scope  . ORIF ANKLE FRACTURE Left 07/29/2017   Procedure: OPEN REDUCTION INTERNAL FIXATION (ORIF) ANKLE FRACTURE;  Surgeon: Corky Mull, MD;  Location: [facility];  Service: Orthopedics;  Laterality: Left;    Medications Prior to Admission  Medication Sig Dispense Refill Last Dose  . acetaminophen (TYLENOL) 500 MG tablet Take 500 mg by mouth every 6 (six) hours as needed.    04/23/2019  . albuterol (PROVENTIL HFA;VENTOLIN HFA) 108 (90 Base) MCG/ACT inhaler Inhale 2 puffs into the lungs every 6 (six) hours as needed for wheezing or shortness of breath. 1 Inhaler 2 04/23/2019  . cyclobenzaprine (FLEXERIL) 10 MG tablet Take 10 mg by mouth at bedtime as needed for muscle spasms.    04/23/2019  . naproxen sodium (ALEVE) 220 MG tablet Take 220 mg by mouth 2 (two) times daily as needed (pain).   04/23/2019  . pantoprazole (PROTONIX) 40 MG tablet TAKE ONE TABLET EVERY DAY (Patient taking differently: Take 40 mg by mouth daily as needed (acid reflux). ) 30 tablet 1 Past Month at Unknown time   No Known Allergies  Social History   Tobacco Use  . Smoking status: Never Smoker  . Smokeless tobacco: Never Used  Substance Use Topics  . Alcohol use: Yes    Comment: occasionally    History reviewed. No pertinent family history.   Review of Systems: As noted above. The patient denies any chest pain, shortness of breath, nausea, vomiting, diarrhea, constipation, belly pain, blood in his/her stool, or burning with urination.  Objective: Temp:  [97 F (36.1 C)] 97 F (36.1 C) (06/24 0618) Pulse Rate:  [68] 68 (06/24 0618) Resp:  [15] 15 (06/24 0618) BP: (133)/(90) 133/90 (06/24 0618) SpO2:  [100 %] 100 % (06/24 0618) Weight:  [122.4 kg] 122.4 kg (06/24 0618)  Physical Exam: General:  Alert, no acute distress Psychiatric:  Patient is competent for consent with normal mood and affect  Cardiovascular:  RRR  Respiratory:  Clear to auscultation. No wheezing. Non-labored breathing GI:  Abdomen is soft and non-tender Skin:  No lesions in the area of chief complaint Neurologic:  Sensation intact distally Lymphatic:  No axillary or cervical lymphadenopathy  Orthopedic Exam:  The patient ambulates with an essentially normal gait, and does not use any assistive devices.  On inspection, his arthroscopic portal sites are well-healed and without evidence for infection.  There is  minimal tenderness to firm palpation over the anteromedial aspect of the ankle, but he has no tenderness over the anterolateral aspect of the ankle.  There is no tenderness to palpation over the medial or lateral malleolar regions of the ankle nor along the heel or heel cord posteriorly.  He is able to dorsiflex his ankle to 5 degrees and plantar flex his ankle to 25 degrees without pain.  Also, he is able to circumduct his ankle both clockwise and counterclockwise without any discomfort.  He is neurovascularly intact to his left foot.  Imaging Review: A recent MRI scan of the left ankle demonstrates the presence of an osteochondral defect of the medial portion of the talar dome.  The fractures are well-healed.  No significant degenerative changes are noted.  Assessment: Osteochondral defect of talar dome status post prior ORIF of unstable left distal fibular fracture.  Plan: The patient presents for a left ankle arthroscopy with debridement and microfracturing of the osteochondral defect, as well as hardware removal of the left ankle.  The potential risks (including bleeding, infection, nerve and/or blood vessel injury, persistent or recurrent pain, loosening or failure of the components, leg length inequality, dislocation, need for further surgery, blood clots, strokes, heart attacks or arrhythmias, pneumonia, etc.) and benefits of the surgical procedure have been discussed. The patient states his understanding and agrees to proceed.  A formal written consent has been obtained by the nursing staff.

## 2019-04-27 NOTE — Discharge Instructions (Addendum)
Orthopedic discharge instructions: Keep dressing dry and intact.  May shower after dressing changed on post-op day #4 (Sunday).  Cover staples/stitches with Band-Aids after drying off. Apply ice frequently to ankle. Take Aleve 2 tabs BID OR ibuprofen 800 mg TID with meals for 7-10 days, then as necessary. Take pain medication as prescribed when needed.  May supplement with ES Tylenol if necessary. May toe-touch to partial weight-bear on left foot in cam walker boot - use crutches. Follow-up in 10-14 days or as scheduled.   AMBULATORY SURGERY  DISCHARGE INSTRUCTIONS   1) The drugs that you were given will stay in your system until tomorrow so for the next 24 hours you should not:  A) Drive an automobile B) Make any legal decisions C) Drink any alcoholic beverage   2) You may resume regular meals tomorrow.  Today it is better to start with liquids and gradually work up to solid foods.  You may eat anything you prefer, but it is better to start with liquids, then soup and crackers, and gradually work up to solid foods.   3) Please notify your doctor immediately if you have any unusual bleeding, trouble breathing, redness and pain at the surgery site, drainage, fever, or pain not relieved by medication.    4) Additional Instructions:        Please contact your physician with any problems or Same Day Surgery at [phone], Monday through Friday 6 am to 4 pm, or  at [facility] number at [phone].

## 2019-04-27 NOTE — Anesthesia Procedure Notes (Signed)
Anesthesia Regional Block: Popliteal block   Pre-Anesthetic Checklist: ,, timeout performed, Correct Patient, Correct Site, Correct Laterality, Correct Procedure, Correct Position, site marked, Risks and benefits discussed,  Surgical consent,  Pre-op evaluation,  At surgeon's request and post-op pain management  Laterality: Right and Left  Prep: chloraprep       Needles:  Injection technique: Single-shot  Needle Type: Stimiplex     Needle Length: 9cm  Needle Gauge: 21     Additional Needles:   Procedures:,,,, ultrasound used (permanent image in chart),,,,  Narrative:  Start time: 04/27/2019 10:58 AM End time: 04/27/2019 11:04 AM Injection made incrementally with aspirations every 5 mL.  Performed by: Personally  Anesthesiologist: Emmie Niemann, MD  Additional Notes: Functioning IV was confirmed and monitors were applied.  A Stimuplex needle was used. Sterile prep and drape,hand hygiene and sterile gloves were used.  Negative aspiration and negative test dose prior to incremental administration of local anesthetic. The patient tolerated the procedure well.

## 2019-04-27 NOTE — Anesthesia Postprocedure Evaluation (Signed)
Anesthesia Post Note  Patient: Adrian Lang  Procedure(s) Performed: ANKLE ARTHROSCOPY LEFT, WITH DEBRIDEMENT AND EXCISION OF OSTEOPHYTES (Left Ankle) HARDWARE REMOVAL (Left Ankle)  Patient location during evaluation: PACU Anesthesia Type: General Level of consciousness: awake and alert and oriented Pain management: pain level controlled Vital Signs Assessment: post-procedure vital signs reviewed and stable Respiratory status: spontaneous breathing, nonlabored ventilation and respiratory function stable Cardiovascular status: blood pressure returned to baseline and stable Postop Assessment: no signs of nausea or vomiting Anesthetic complications: no     Last Vitals:  Vitals:   04/27/19 1105 04/27/19 1110  BP: 127/77 124/81  Pulse: 88 83  Resp: 13 15  Temp:    SpO2: 96% 96%    Last Pain:  Vitals:   04/27/19 1110  TempSrc:   PainSc: 4                  Tytionna Cloyd

## 2019-04-27 NOTE — Anesthesia Procedure Notes (Signed)
Procedure Name: LMA Insertion Date/Time: 04/27/2019 7:36 AM Performed by: Lowry Bowl, CRNA Pre-anesthesia Checklist: Patient identified, Emergency Drugs available, Suction available and Patient being monitored Patient Re-evaluated:Patient Re-evaluated prior to induction Oxygen Delivery Method: Circle system utilized Preoxygenation: Pre-oxygenation with 100% oxygen Induction Type: IV induction Ventilation: Mask ventilation without difficulty LMA: LMA inserted LMA Size: 5.0 Number of attempts: 1 Placement Confirmation: positive ETCO2 and breath sounds checked- equal and bilateral Tube secured with: Tape Dental Injury: Teeth and Oropharynx as per pre-operative assessment

## 2019-04-27 NOTE — Anesthesia Post-op Follow-up Note (Signed)
Anesthesia QCDR form completed.        

## 2019-04-28 ENCOUNTER — Ambulatory Visit: Payer: BLUE CROSS/BLUE SHIELD | Admitting: Dietician

## 2019-05-26 ENCOUNTER — Ambulatory Visit: Payer: BLUE CROSS/BLUE SHIELD | Admitting: Internal Medicine

## 2019-05-31 ENCOUNTER — Other Ambulatory Visit: Payer: Self-pay

## 2019-05-31 ENCOUNTER — Encounter: Payer: Self-pay | Admitting: Internal Medicine

## 2019-05-31 ENCOUNTER — Ambulatory Visit (INDEPENDENT_AMBULATORY_CARE_PROVIDER_SITE_OTHER): Payer: 59 | Admitting: Internal Medicine

## 2019-05-31 DIAGNOSIS — G4733 Obstructive sleep apnea (adult) (pediatric): Secondary | ICD-10-CM

## 2019-05-31 NOTE — Progress Notes (Signed)
I connected with the patient by video/telephone enabled telemedicine visit and verified that I am speaking with the correct person using two identifiers.    I discussed the limitations, risks, security and privacy concerns of performing an evaluation and management service by telemedicine and the availability of in-person appointments. I also discussed with the patient that there may be a patient responsible charge related to this service. The patient expressed understanding and agreed to proceed.  PATIENT AGREES AND CONFIRMS -YES   Other persons participating in the visit and their role in the encounter: Patient, nursing   Patient's location: Home Provider's location: Clinic   I discussed the limitations, risks, security and privacy concerns of performing an evaluation and management service by telephone and the availability of in person appointments. I also discussed with the patient that there may be a patient responsible charge related to this service. The patient expressed understanding and agreed to proceed.  This visit type was conducted due to national recommendations for restrictions regarding the COVID-19 Pandemic (e.g. social distancing).  This format is felt to be most appropriate for this patient at this time.  All issues noted in this document were discussed and addressed.           Date:  05/31/2019   ID:  Adrian LandryBrian Patrick Lang, DOB Dec 09, 1985, MRN [id number]   [facility]  PCP:  Jamelle HaringHendrickson, Clifford D, MD   Chief Complaint:   Follow up OSA  History of Present Illness:    More energy Less fatigued More rested Refreshed sleep  No evidence of resp distress  AUTO CPAP 5-20 cm h20 Mask is too small Bigger mask coming soon  Excellent compliance 100%    Discussed sleep data and reviewed with patient.  Encouraged proper weight management.  Discussed driving precautions and its relationship with  hypersomnolence.  Discussed operating dangerous equipment and its relationship with hypersomnolence.  Discussed sleep hygiene, and benefits of a fixed sleep waked time.  The importance of getting eight or more hours of sleep discussed with patient.  Discussed limiting the use of the computer and television before bedtime.  Decrease naps during the day, so night time sleep will become enhanced.  Limit caffeine, and sleep deprivation.  HTN, stroke, and heart failure are potential risk factors.     Patient weighs 270 pounds 18.5 inch neck size    Current Outpatient Medications on File Prior to Visit  Medication Sig Dispense Refill  . acetaminophen (TYLENOL) 500 MG tablet Take 500 mg by mouth every 6 (six) hours as needed.    Marland Kitchen. albuterol (PROVENTIL HFA;VENTOLIN HFA) 108 (90 Base) MCG/ACT inhaler Inhale 2 puffs into the lungs every 6 (six) hours as needed for wheezing or shortness of breath. 1 Inhaler 2  . cyclobenzaprine (FLEXERIL) 10 MG tablet Take 10 mg by mouth at bedtime as needed for muscle spasms.     . naproxen sodium (ALEVE) 220 MG tablet Take 220 mg by mouth 2 (two) times daily as needed (pain).    Marland Kitchen. oxyCODONE (ROXICODONE) 5 MG immediate release tablet Take 1-2 tablets (5-10 mg total) by mouth every 4 (four) hours as needed for moderate pain or severe pain. 40 tablet 0  . pantoprazole (PROTONIX) 40 MG tablet TAKE ONE TABLET EVERY DAY (Patient taking differently: Take 40 mg by mouth daily as needed (acid reflux). ) 30 tablet 1   No current facility-administered medications on file prior to visit.  Allergies:   Patient has no known allergies.   Social History   Tobacco Use  . Smoking status: Never Smoker  . Smokeless tobacco: Never Used  Substance Use Topics  . Alcohol use: Yes    Comment: occasionally  . Drug use: Never       Review of Systems:  Gen:  Denies  fever, sweats, chills weight loss  HEENT: Denies blurred vision, double vision, ear pain, eye pain,  hearing loss, nose bleeds, sore throat Cardiac:  No dizziness, chest pain or heaviness, chest tightness,edema, No JVD Resp:   No cough, -sputum production, -shortness of breath,-wheezing, -hemoptysis,  Gi: Denies swallowing difficulty, stomach pain, nausea or vomiting, diarrhea, constipation, bowel incontinence Gu:  Denies bladder incontinence, burning urine Ext:   Denies Joint pain, stiffness or swelling Skin: Denies  skin rash, easy bruising or bleeding or hives Endoc:  Denies polyuria, polydipsia , polyphagia or weight change Psych:   Denies depression, insomnia or hallucinations  Other:  All other systems negative         ASSESSMENT & PLAN:     33 year old morbidly obese white male with evidence of mild to moderate sleep apnea with AHI of 11 After auto CPAP therapy 5 to 20 cm of water pressure AHI is down to 2.3 With an excellent compliance report Findings relayed to patient Patient is feeling much better in terms of his energy less sleepy less fatigued  Patient using and definitely benefiting from CPAP therapy  Continue auto CPAP as prescribed Patient is to get bigger mask to decrease the amount of leak     Obesity -recommend significant weight loss -recommend changing diet  Deconditioned state -Recommend increased daily activity and exercise  COVID-19 EDUCATION: The signs and symptoms of COVID-19 were discussed with the patient and how to seek care for testing.  The importance of social distancing was discussed today. Hand Washing Techniques and avoid touching face was advised.  MEDICATION ADJUSTMENTS/LABS AND TESTS ORDERED: Continue auto CPAP as prescribed   CURRENT MEDICATIONS REVIEWED AT LENGTH WITH PATIENT TODAY   Patient satisfied with Plan of action and management. All questions answered  Follow up in 6 months  Total time spent 23 minutes  Corrin Parker, M.D.  [facility]  Medical Director [place] Director [facility] Cardio-Pulmonary Department

## 2019-05-31 NOTE — Patient Instructions (Signed)
Continue CPAP as prescribed  Recommend weight loss   

## 2019-07-20 NOTE — Telephone Encounter (Signed)
It is attached under arrachements. I do print it and place it in your folder.

## 2019-07-20 NOTE — Telephone Encounter (Signed)
DK please advise. Thanks 

## 2019-07-21 ENCOUNTER — Telehealth: Payer: Self-pay | Admitting: Internal Medicine

## 2019-07-21 NOTE — Telephone Encounter (Signed)
Pt is aware that paper work is ready for pick up. Nothing further is needed.

## 2019-08-20 IMAGING — CR DG LUMBAR SPINE COMPLETE 4+V
1 series · 5 of 5 positions shown · non-contrast
Comparison: None

CLINICAL DATA: Chronic low back pain, sharp pain after bending
over, greater on RIGHT side

EXAM:
LUMBAR SPINE - COMPLETE 4+ VIEW

[Series 1: dg lumbar spine complete 4 +v · 0.14mm/px · 5 of 5 slices shown]
[im 1/5]
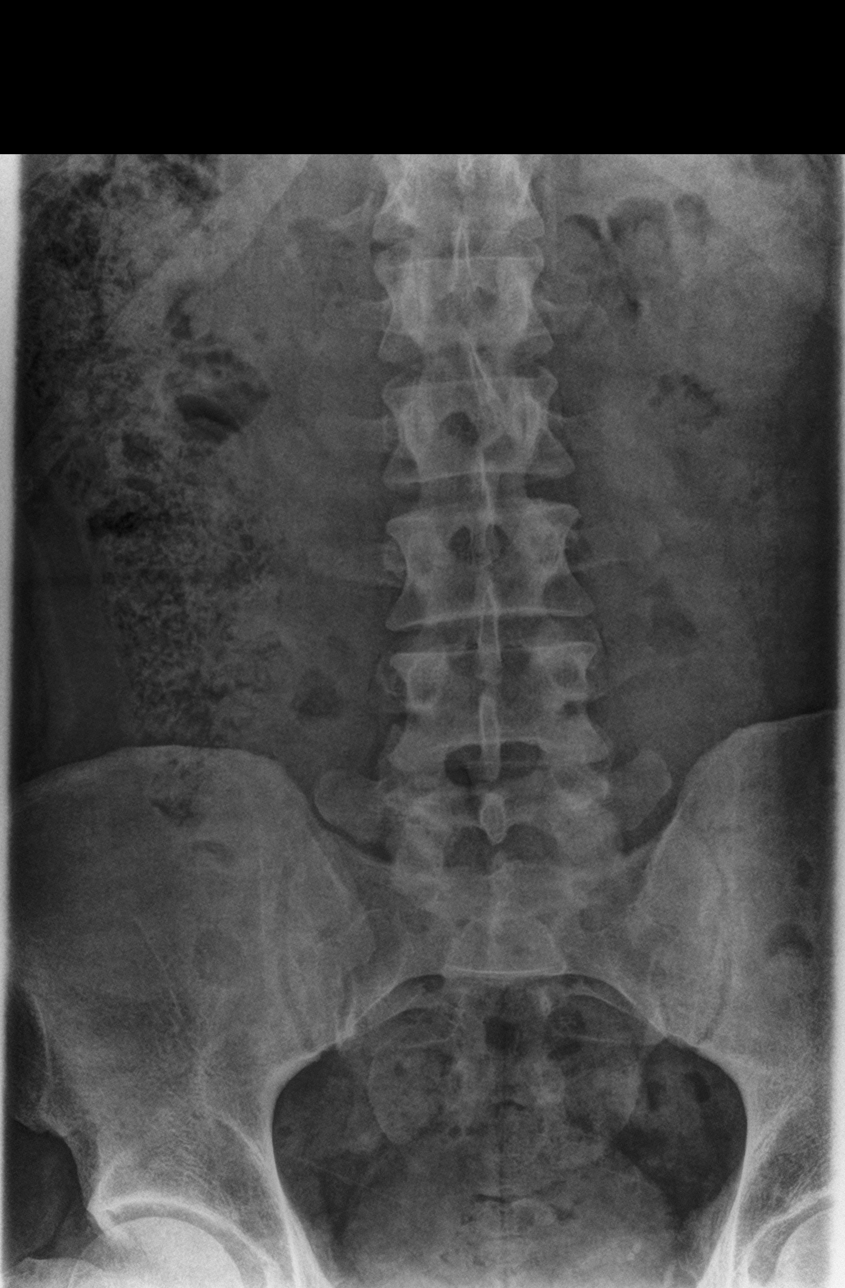
[im 2/5]
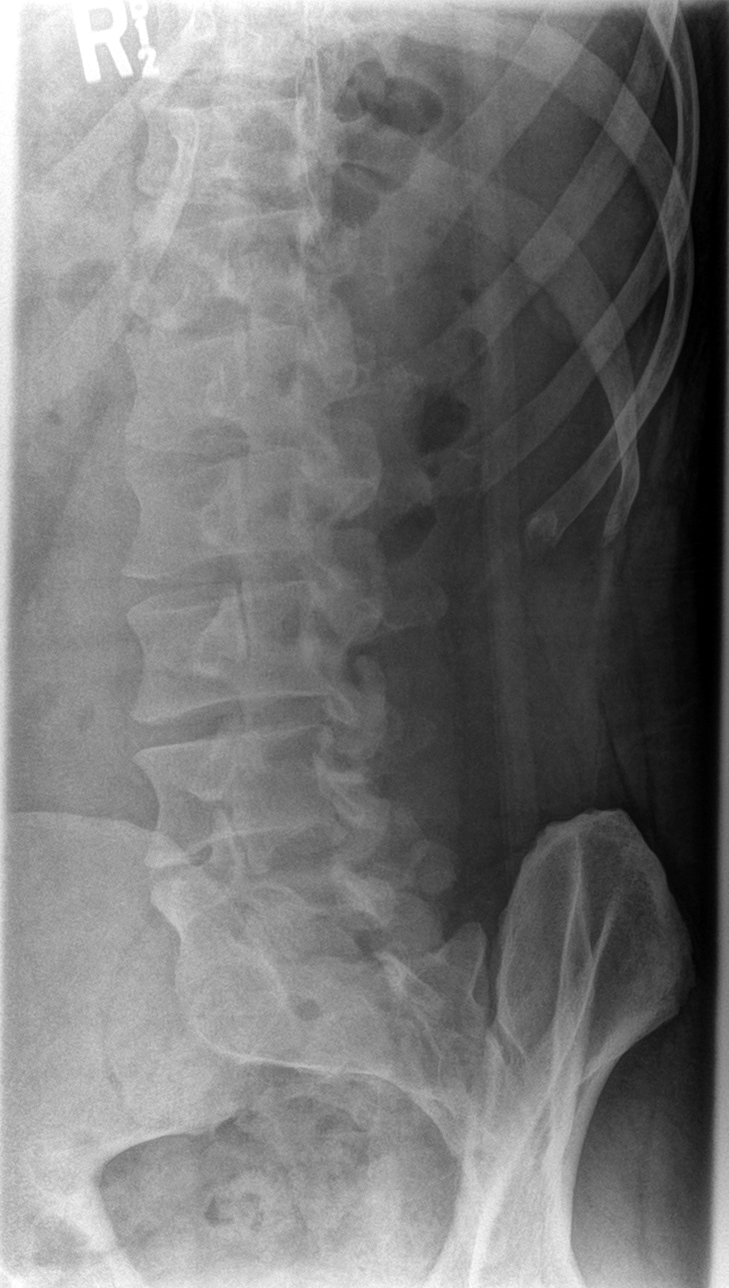
[im 3/5]
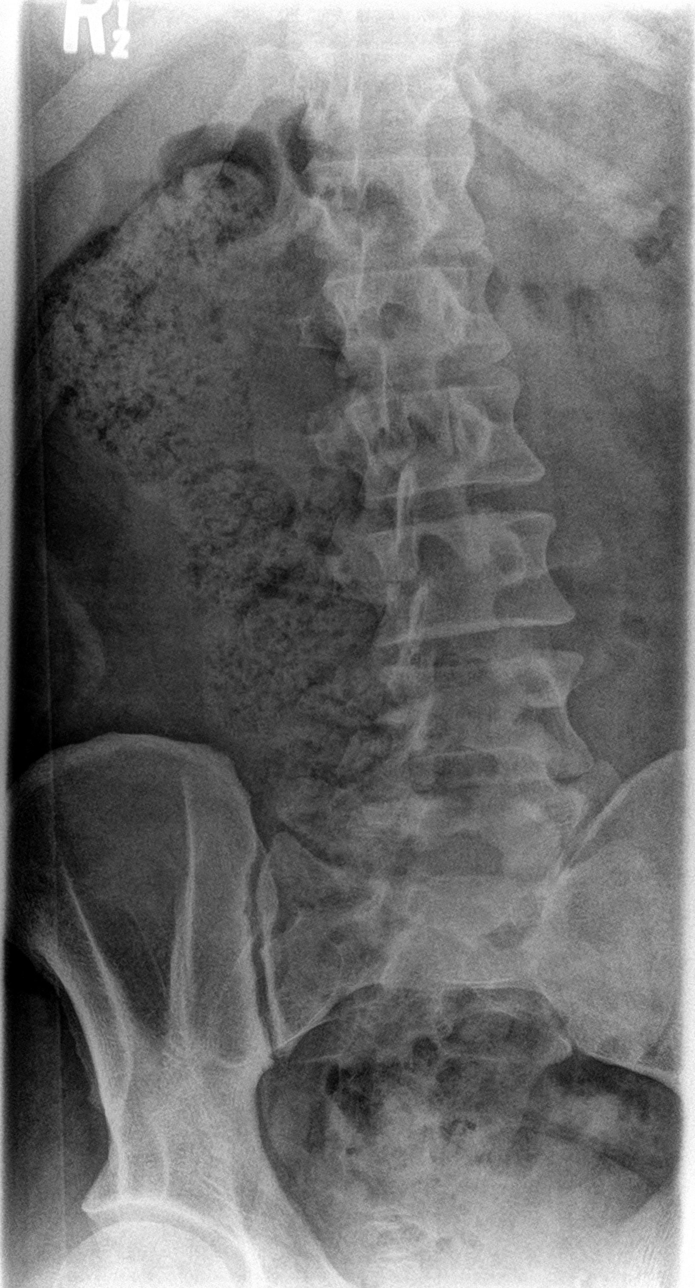
[im 4/5]
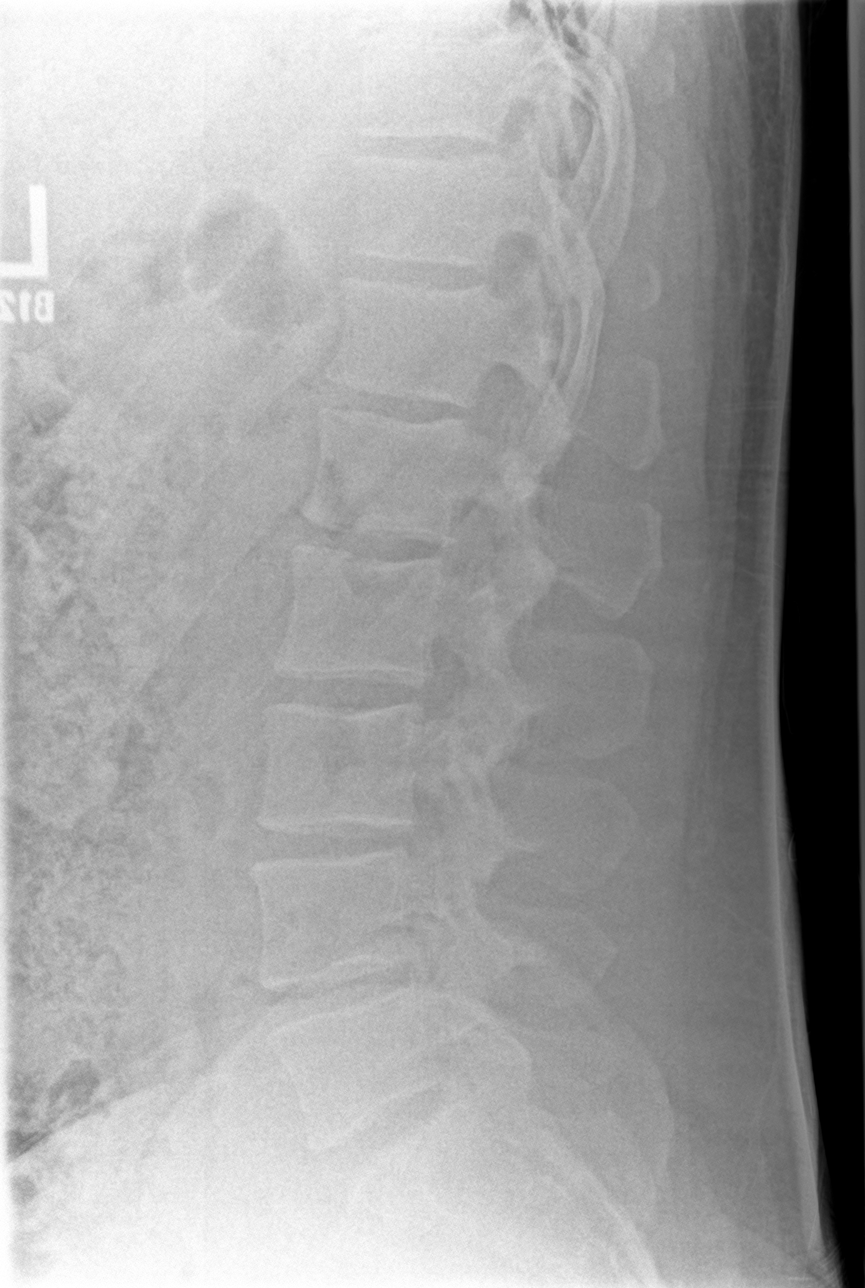
[im 5/5]
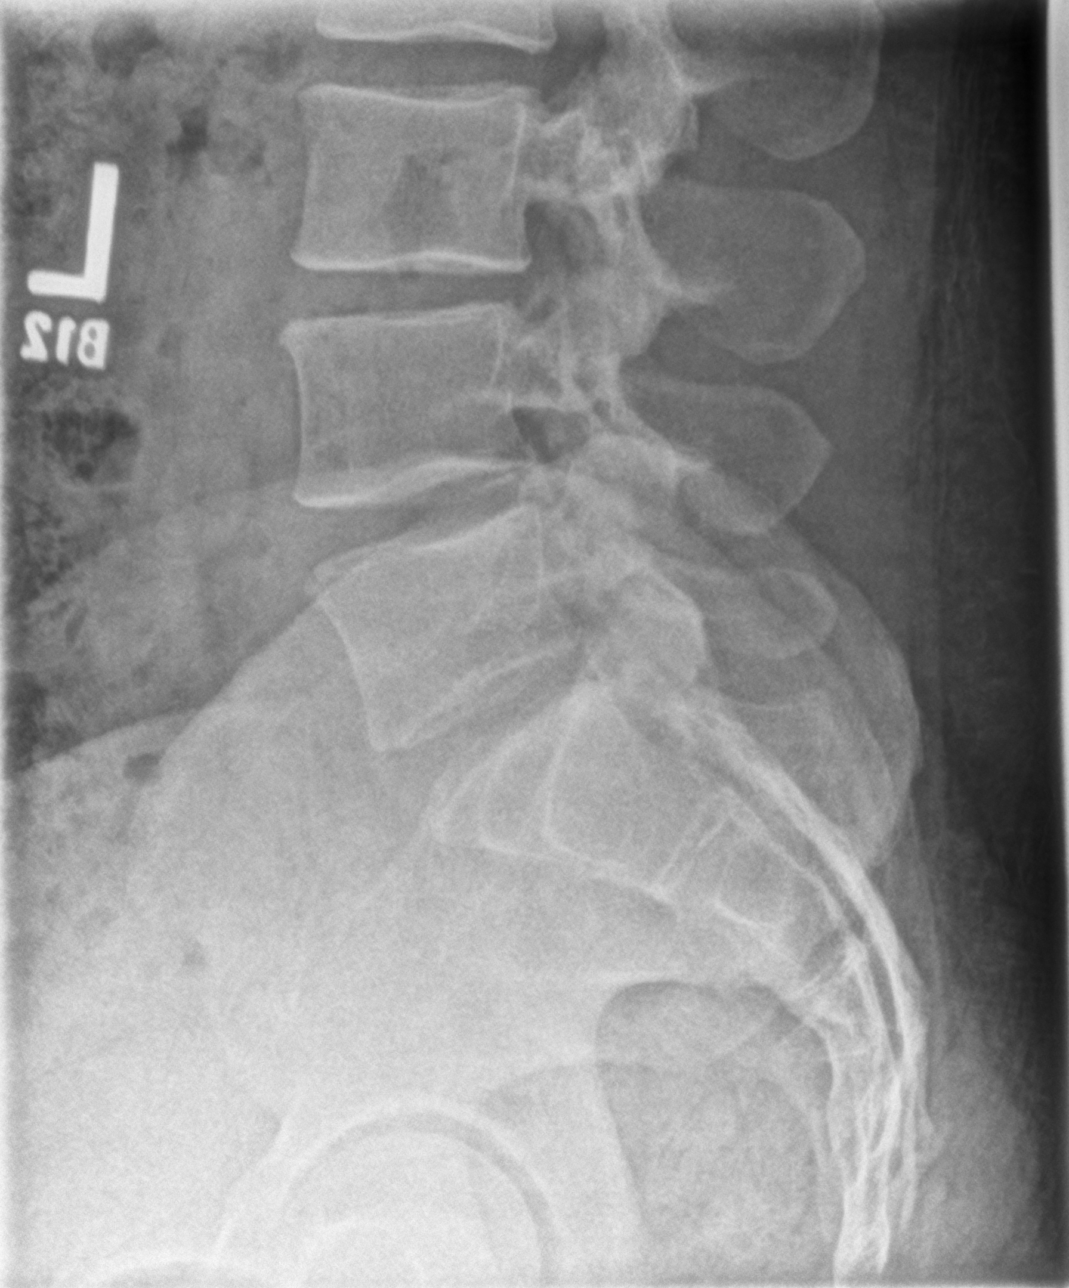

[5 of 5 positions shown; findings below may reference images not displayed]

FINDINGS: Osseous mineralization grossly normal for technique.

Hypoplastic last ribs.

Five non-rib-bearing lumbar vertebra.

Vertebral body and disc space heights maintained.

No acute fracture, subluxation, or bone destruction.

No spondylolysis.

SI joints preserved.
IMPRESSION: No acute abnormalities.

## 2019-09-28 ENCOUNTER — Telehealth: Payer: Self-pay

## 2019-09-28 NOTE — Telephone Encounter (Signed)
Received 09/19/2019 office notes from Dr. Milagros Evener of [facility].  Notes reviewed by Gerarda Fraction, NP-C (Interim Provider) on 09/21/2019.  Noted as follows:  Reviewed 3/3 pages.  Still limited to light duty - avoid running, prolonged standing or ambulation.  F/U with Dr. Roland Rack in 8 weeks.  AMD

## 2019-12-12 DIAGNOSIS — M958 Other specified acquired deformities of musculoskeletal system: Secondary | ICD-10-CM | POA: Diagnosis not present

## 2019-12-12 DIAGNOSIS — Z8781 Personal history of (healed) traumatic fracture: Secondary | ICD-10-CM | POA: Diagnosis not present

## 2019-12-12 DIAGNOSIS — Z9889 Other specified postprocedural states: Secondary | ICD-10-CM | POA: Diagnosis not present

## 2019-12-12 DIAGNOSIS — Z969 Presence of functional implant, unspecified: Secondary | ICD-10-CM | POA: Diagnosis not present

## 2019-12-19 DIAGNOSIS — M9904 Segmental and somatic dysfunction of sacral region: Secondary | ICD-10-CM | POA: Diagnosis not present

## 2019-12-19 DIAGNOSIS — M9901 Segmental and somatic dysfunction of cervical region: Secondary | ICD-10-CM | POA: Diagnosis not present

## 2019-12-19 DIAGNOSIS — M9903 Segmental and somatic dysfunction of lumbar region: Secondary | ICD-10-CM | POA: Diagnosis not present

## 2019-12-19 DIAGNOSIS — M542 Cervicalgia: Secondary | ICD-10-CM | POA: Diagnosis not present

## 2019-12-19 DIAGNOSIS — M9902 Segmental and somatic dysfunction of thoracic region: Secondary | ICD-10-CM | POA: Diagnosis not present

## 2019-12-19 DIAGNOSIS — M461 Sacroiliitis, not elsewhere classified: Secondary | ICD-10-CM | POA: Diagnosis not present

## 2019-12-20 DIAGNOSIS — G4733 Obstructive sleep apnea (adult) (pediatric): Secondary | ICD-10-CM | POA: Diagnosis not present

## 2019-12-20 DIAGNOSIS — G4710 Hypersomnia, unspecified: Secondary | ICD-10-CM | POA: Diagnosis not present

## 2020-01-05 DIAGNOSIS — M542 Cervicalgia: Secondary | ICD-10-CM | POA: Diagnosis not present

## 2020-01-05 DIAGNOSIS — M461 Sacroiliitis, not elsewhere classified: Secondary | ICD-10-CM | POA: Diagnosis not present

## 2020-01-05 DIAGNOSIS — M9904 Segmental and somatic dysfunction of sacral region: Secondary | ICD-10-CM | POA: Diagnosis not present

## 2020-01-05 DIAGNOSIS — M9901 Segmental and somatic dysfunction of cervical region: Secondary | ICD-10-CM | POA: Diagnosis not present

## 2020-01-05 DIAGNOSIS — M9902 Segmental and somatic dysfunction of thoracic region: Secondary | ICD-10-CM | POA: Diagnosis not present

## 2020-01-05 DIAGNOSIS — M9903 Segmental and somatic dysfunction of lumbar region: Secondary | ICD-10-CM | POA: Diagnosis not present

## 2020-01-17 DIAGNOSIS — G4733 Obstructive sleep apnea (adult) (pediatric): Secondary | ICD-10-CM | POA: Diagnosis not present

## 2020-01-17 DIAGNOSIS — G4710 Hypersomnia, unspecified: Secondary | ICD-10-CM | POA: Diagnosis not present

## 2020-02-09 DIAGNOSIS — M9902 Segmental and somatic dysfunction of thoracic region: Secondary | ICD-10-CM | POA: Diagnosis not present

## 2020-02-09 DIAGNOSIS — M9901 Segmental and somatic dysfunction of cervical region: Secondary | ICD-10-CM | POA: Diagnosis not present

## 2020-02-09 DIAGNOSIS — M9904 Segmental and somatic dysfunction of sacral region: Secondary | ICD-10-CM | POA: Diagnosis not present

## 2020-02-09 DIAGNOSIS — M542 Cervicalgia: Secondary | ICD-10-CM | POA: Diagnosis not present

## 2020-02-09 DIAGNOSIS — M461 Sacroiliitis, not elsewhere classified: Secondary | ICD-10-CM | POA: Diagnosis not present

## 2020-02-09 DIAGNOSIS — M9903 Segmental and somatic dysfunction of lumbar region: Secondary | ICD-10-CM | POA: Diagnosis not present

## 2020-02-23 DIAGNOSIS — M9902 Segmental and somatic dysfunction of thoracic region: Secondary | ICD-10-CM | POA: Diagnosis not present

## 2020-02-23 DIAGNOSIS — M9901 Segmental and somatic dysfunction of cervical region: Secondary | ICD-10-CM | POA: Diagnosis not present

## 2020-02-23 DIAGNOSIS — M9903 Segmental and somatic dysfunction of lumbar region: Secondary | ICD-10-CM | POA: Diagnosis not present

## 2020-02-23 DIAGNOSIS — M9904 Segmental and somatic dysfunction of sacral region: Secondary | ICD-10-CM | POA: Diagnosis not present

## 2020-02-23 DIAGNOSIS — M461 Sacroiliitis, not elsewhere classified: Secondary | ICD-10-CM | POA: Diagnosis not present

## 2020-02-23 DIAGNOSIS — M542 Cervicalgia: Secondary | ICD-10-CM | POA: Diagnosis not present

## 2020-03-15 DIAGNOSIS — M461 Sacroiliitis, not elsewhere classified: Secondary | ICD-10-CM | POA: Diagnosis not present

## 2020-03-15 DIAGNOSIS — M9903 Segmental and somatic dysfunction of lumbar region: Secondary | ICD-10-CM | POA: Diagnosis not present

## 2020-03-15 DIAGNOSIS — M542 Cervicalgia: Secondary | ICD-10-CM | POA: Diagnosis not present

## 2020-03-15 DIAGNOSIS — M9901 Segmental and somatic dysfunction of cervical region: Secondary | ICD-10-CM | POA: Diagnosis not present

## 2020-03-15 DIAGNOSIS — M9904 Segmental and somatic dysfunction of sacral region: Secondary | ICD-10-CM | POA: Diagnosis not present

## 2020-03-15 DIAGNOSIS — M9902 Segmental and somatic dysfunction of thoracic region: Secondary | ICD-10-CM | POA: Diagnosis not present

## 2020-03-29 NOTE — Progress Notes (Signed)
Scheduled to complete physical 04/03/20 with Bridget Hartshorn, PA-C.  AMD

## 2020-03-30 ENCOUNTER — Other Ambulatory Visit: Payer: Self-pay

## 2020-03-30 ENCOUNTER — Ambulatory Visit: Payer: Self-pay

## 2020-03-30 DIAGNOSIS — Z0000 Encounter for general adult medical examination without abnormal findings: Secondary | ICD-10-CM

## 2020-03-30 LAB — POCT URINALYSIS DIPSTICK
Bilirubin, UA: NEGATIVE
Blood, UA: NEGATIVE
Glucose, UA: NEGATIVE
Ketones, UA: NEGATIVE
Leukocytes, UA: NEGATIVE
Nitrite, UA: NEGATIVE
Protein, UA: NEGATIVE
Spec Grav, UA: 1.015 (ref 1.010–1.025)
Urobilinogen, UA: 0.2 U/dL
pH, UA: 7 (ref 5.0–8.0)

## 2020-03-31 LAB — CMP12+LP+TP+TSH+6AC+CBC/D/PLT
ALT: 53 IU/L — ABNORMAL HIGH (ref 0–44)
AST: 23 IU/L (ref 0–40)
Albumin/Globulin Ratio: 2.1 (ref 1.2–2.2)
Albumin: 4.7 g/dL (ref 4.0–5.0)
Alkaline Phosphatase: 70 IU/L (ref 48–121)
BUN/Creatinine Ratio: 18 (ref 9–20)
BUN: 19 mg/dL (ref 6–20)
Basophils Absolute: 0.1 10*3/uL (ref 0.0–0.2)
Basos: 1 %
Bilirubin Total: 0.8 mg/dL (ref 0.0–1.2)
Calcium: 9.5 mg/dL (ref 8.7–10.2)
Chloride: 101 mmol/L (ref 96–106)
Chol/HDL Ratio: 4.5 ratio (ref 0.0–5.0)
Cholesterol, Total: 191 mg/dL (ref 100–199)
Creatinine, Ser: 1.06 mg/dL (ref 0.76–1.27)
EOS (ABSOLUTE): 0.3 10*3/uL (ref 0.0–0.4)
Eos: 5 %
Estimated CHD Risk: 0.9 times avg. (ref 0.0–1.0)
Free Thyroxine Index: 2.2 (ref 1.2–4.9)
GFR calc Af Amer: 105 mL/min/{1.73_m2} (ref >59)
GFR calc non Af Amer: 90 mL/min/{1.73_m2} (ref >59)
GGT: 12 IU/L (ref 0–65)
Globulin, Total: 2.2 g/dL (ref 1.5–4.5)
Glucose: 97 mg/dL (ref 65–99)
HDL: 42 mg/dL (ref >39)
Hematocrit: 43.5 % (ref 37.5–51.0)
Hemoglobin: 14.6 g/dL (ref 13.0–17.7)
Immature Grans (Abs): 0 10*3/uL (ref 0.0–0.1)
Immature Granulocytes: 0 %
Iron: 90 ug/dL (ref 38–169)
LDH: 190 IU/L (ref 121–224)
LDL Chol Calc (NIH): 133 mg/dL — ABNORMAL HIGH (ref 0–99)
Lymphocytes Absolute: 1.2 10*3/uL (ref 0.7–3.1)
Lymphs: 21 %
MCH: 28.1 pg (ref 26.6–33.0)
MCHC: 33.6 g/dL (ref 31.5–35.7)
MCV: 84 fL (ref 79–97)
Monocytes Absolute: 0.6 10*3/uL (ref 0.1–0.9)
Monocytes: 11 %
Neutrophils Absolute: 3.7 10*3/uL (ref 1.4–7.0)
Neutrophils: 62 %
Phosphorus: 3.7 mg/dL (ref 2.8–4.1)
Platelets: 203 10*3/uL (ref 150–450)
Potassium: 4.2 mmol/L (ref 3.5–5.2)
RBC: 5.2 x10E6/uL (ref 4.14–5.80)
RDW: 12.4 % (ref 11.6–15.4)
Sodium: 142 mmol/L (ref 134–144)
T3 Uptake Ratio: 28 % (ref 24–39)
T4, Total: 7.7 ug/dL (ref 4.5–12.0)
TSH: 1.27 u[IU]/mL (ref 0.450–4.500)
Total Protein: 6.9 g/dL (ref 6.0–8.5)
Triglycerides: 88 mg/dL (ref 0–149)
Uric Acid: 5.4 mg/dL (ref 3.8–8.4)
VLDL Cholesterol Cal: 16 mg/dL (ref 5–40)
WBC: 5.9 10*3/uL (ref 3.4–10.8)

## 2020-04-03 ENCOUNTER — Ambulatory Visit: Payer: Self-pay | Admitting: Emergency Medicine

## 2020-04-03 ENCOUNTER — Other Ambulatory Visit: Payer: Self-pay

## 2020-04-03 ENCOUNTER — Encounter: Payer: Self-pay | Admitting: Emergency Medicine

## 2020-04-03 VITALS — BP 138/88 | HR 79 | Temp 97.2°F | Resp 12 | Ht 72.0 in | Wt 273.0 lb

## 2020-04-03 DIAGNOSIS — Z0000 Encounter for general adult medical examination without abnormal findings: Secondary | ICD-10-CM

## 2020-04-03 NOTE — Progress Notes (Signed)
I have reviewed the triage vital signs and the nursing notes.   HISTORY  Chief Complaint Annual Exam  HPI Adrian Lang is a 34 y.o. male presents to the clinic for an annual physical.  Patient denies any new complaints.  He was excited about the arrival of twins this summer.  He also has 2 other children.      Past Medical History:  Diagnosis Date   Arthritis    left ankle   Elevated LDL cholesterol level    GERD (gastroesophageal reflux disease)    can cause wheezing if gets bad   Low back pain    Obesity    Rectal bleeding    Sleep apnea    uses cpap.  has mild case of sleep apnea   Wheezing    at night mostly    Patient Active Problem List   Diagnosis Date Noted   Osteochondral defect of talus 04/27/2019   Status post ORIF of fracture of ankle 10/07/2018   Ankle fracture 07/28/2017    Past Surgical History:  Procedure Laterality Date   ANKLE ARTHROSCOPY Left 10/06/2018   Procedure: ARTHROSCOPIC DEBRIDEMENT OF PAINFUL SCAR TISSUE FROM LEFT ANKLE RETAINED ORTHOPEDIC HARDWARE;  Surgeon: Corky Mull, MD;  Location: [place];  Service: Orthopedics;  Laterality: Left;  SMALL JOINT ARTHROSCOPY SYSTEM WITH THE 2.9 MM FULL RADIUS RESECTOR   ANKLE ARTHROSCOPY Left 04/27/2019   Procedure: ANKLE ARTHROSCOPY LEFT, WITH DEBRIDEMENT AND EXCISION OF OSTEOPHYTES;  Surgeon: Corky Mull, MD;  Location: [facility];  Service: Orthopedics;  Laterality: Left;   HARDWARE REMOVAL Left 04/27/2019   Procedure: HARDWARE REMOVAL;  Surgeon: Corky Mull, MD;  Location: [facility];  Service: Orthopedics;  Laterality: Left;   KNEE SURGERY Right 2012   knee scope   ORIF ANKLE FRACTURE Left 07/29/2017   Procedure: OPEN REDUCTION INTERNAL FIXATION (ORIF) ANKLE FRACTURE;  Surgeon: Corky Mull, MD;  Location: [facility];  Service: Orthopedics;  Laterality: Left;   TONSILECTOMY, ADENOIDECTOMY, BILATERAL MYRINGOTOMY AND TUBES     variceal vein repair Left    When a  teenager    Prior to Admission medications   Medication Sig Start Date End Date Taking? Authorizing Provider  naproxen sodium (ALEVE) 220 MG tablet Take 220 mg by mouth 2 (two) times daily as needed (pain).   Yes [provider]  acetaminophen (TYLENOL) 500 MG tablet Take 500 mg by mouth every 6 (six) hours as needed.    [provider]  albuterol (PROVENTIL HFA;VENTOLIN HFA) 108 (90 Base) MCG/ACT inhaler Inhale 2 puffs into the lungs every 6 (six) hours as needed for wheezing or shortness of breath. 01/11/19   Flora Lipps, MD  cyclobenzaprine (FLEXERIL) 10 MG tablet Take 10 mg by mouth at bedtime as needed for muscle spasms.  02/01/19   [provider]  oxyCODONE (ROXICODONE) 5 MG immediate release tablet Take 1-2 tablets (5-10 mg total) by mouth every 4 (four) hours as needed for moderate pain or severe pain. 04/27/19   Poggi, Marshall Cork, MD  pantoprazole (PROTONIX) 40 MG tablet TAKE ONE TABLET EVERY DAY Patient not taking: No sig reported 02/14/19   Flora Lipps, MD    Allergies Patient has no known allergies.  Family History  Problem Relation Age of Onset   Breast cancer Mother     Social History Social History   Tobacco Use   Smoking status: Never Smoker   Smokeless tobacco: Never Used  Substance Use Topics  Alcohol use: Yes    Comment: occasionally   Drug use: Never    Review of Systems Constitutional: No fever/chills Eyes: No visual changes. ENT: No sore throat. Cardiovascular: Denies chest pain. Respiratory: Denies shortness of breath. Gastrointestinal: No abdominal pain.  No nausea, no vomiting.   Genitourinary: Negative for dysuria. Musculoskeletal: negative for muscle skeletal pain. Skin: Negative for rash. Neurological: Negative for headaches, focal weakness or numbness. ____________________________________________   PHYSICAL EXAM: Constitutional: Alert and oriented. Well appearing and in no acute distress. Eyes: Conjunctivae are  normal. PERRL. EOMI. Head: Atraumatic. Nose: No congestion/rhinnorhea.  EACs and TMs are clear bilaterally. Mouth/Throat: Mucous membranes are moist.  Oropharynx non-erythematous. Neck: No stridor.   Cardiovascular: Normal rate, regular rhythm. Grossly normal heart sounds.  Good peripheral circulation. Respiratory: Normal respiratory effort.  No retractions. Lungs CTAB. Gastrointestinal: Soft and nontender. No distention.  Bowel sounds normoactive x4 quadrants. Musculoskeletal: No lower extremity tenderness nor edema.  Moves upper and lower extremities that any difficulty.  Normal gait was noted.  No tenderness is noted on palpation of the the thoracic or lumbar spine Neurologic:  Normal speech and language. No gross focal neurologic deficits are appreciated. No gait instability. Skin:  Skin is warm, dry and intact. No rash noted. Psychiatric: Mood and affect are normal. Speech and behavior are normal.  ____________________________________________   LABS (all labs ordered are listed, but only abnormal results are displayed)  Labs were discussed. ____________________________________________    FINAL CLINICAL IMPRESSION(S)   Normal male annual physical   ED Discharge Orders         Ordered    Hepatic function panel  Status:  Canceled     04/03/20 1418           Note:  This document was prepared using Dragon voice recognition software and may include unintentional dictation errors.

## 2020-04-16 DIAGNOSIS — G4733 Obstructive sleep apnea (adult) (pediatric): Secondary | ICD-10-CM | POA: Diagnosis not present

## 2020-04-26 DIAGNOSIS — M9901 Segmental and somatic dysfunction of cervical region: Secondary | ICD-10-CM | POA: Diagnosis not present

## 2020-04-26 DIAGNOSIS — M9902 Segmental and somatic dysfunction of thoracic region: Secondary | ICD-10-CM | POA: Diagnosis not present

## 2020-04-26 DIAGNOSIS — M542 Cervicalgia: Secondary | ICD-10-CM | POA: Diagnosis not present

## 2020-04-26 DIAGNOSIS — M9904 Segmental and somatic dysfunction of sacral region: Secondary | ICD-10-CM | POA: Diagnosis not present

## 2020-04-26 DIAGNOSIS — M461 Sacroiliitis, not elsewhere classified: Secondary | ICD-10-CM | POA: Diagnosis not present

## 2020-04-26 DIAGNOSIS — M9903 Segmental and somatic dysfunction of lumbar region: Secondary | ICD-10-CM | POA: Diagnosis not present

## 2020-05-04 DIAGNOSIS — Z8781 Personal history of (healed) traumatic fracture: Secondary | ICD-10-CM | POA: Diagnosis not present

## 2020-05-04 DIAGNOSIS — Z9889 Other specified postprocedural states: Secondary | ICD-10-CM | POA: Diagnosis not present

## 2020-05-04 DIAGNOSIS — M958 Other specified acquired deformities of musculoskeletal system: Secondary | ICD-10-CM | POA: Diagnosis not present

## 2020-05-16 DIAGNOSIS — G4733 Obstructive sleep apnea (adult) (pediatric): Secondary | ICD-10-CM | POA: Diagnosis not present

## 2020-05-24 DIAGNOSIS — M9902 Segmental and somatic dysfunction of thoracic region: Secondary | ICD-10-CM | POA: Diagnosis not present

## 2020-05-24 DIAGNOSIS — M9904 Segmental and somatic dysfunction of sacral region: Secondary | ICD-10-CM | POA: Diagnosis not present

## 2020-05-24 DIAGNOSIS — M9903 Segmental and somatic dysfunction of lumbar region: Secondary | ICD-10-CM | POA: Diagnosis not present

## 2020-05-24 DIAGNOSIS — M9901 Segmental and somatic dysfunction of cervical region: Secondary | ICD-10-CM | POA: Diagnosis not present

## 2020-06-16 DIAGNOSIS — G4733 Obstructive sleep apnea (adult) (pediatric): Secondary | ICD-10-CM | POA: Diagnosis not present

## 2020-07-16 DIAGNOSIS — G4733 Obstructive sleep apnea (adult) (pediatric): Secondary | ICD-10-CM | POA: Diagnosis not present

## 2020-08-15 DIAGNOSIS — G4733 Obstructive sleep apnea (adult) (pediatric): Secondary | ICD-10-CM | POA: Diagnosis not present

## 2020-09-15 DIAGNOSIS — G4733 Obstructive sleep apnea (adult) (pediatric): Secondary | ICD-10-CM | POA: Diagnosis not present

## 2020-10-15 DIAGNOSIS — G4733 Obstructive sleep apnea (adult) (pediatric): Secondary | ICD-10-CM | POA: Diagnosis not present

## 2020-10-23 ENCOUNTER — Other Ambulatory Visit: Payer: Self-pay

## 2020-10-23 DIAGNOSIS — Z03818 Encounter for observation for suspected exposure to other biological agents ruled out: Secondary | ICD-10-CM | POA: Diagnosis not present

## 2020-10-23 DIAGNOSIS — J069 Acute upper respiratory infection, unspecified: Secondary | ICD-10-CM | POA: Diagnosis not present

## 2020-11-15 DIAGNOSIS — G4733 Obstructive sleep apnea (adult) (pediatric): Secondary | ICD-10-CM | POA: Diagnosis not present

## 2020-12-16 DIAGNOSIS — G4733 Obstructive sleep apnea (adult) (pediatric): Secondary | ICD-10-CM | POA: Diagnosis not present

## 2020-12-24 DIAGNOSIS — M25569 Pain in unspecified knee: Secondary | ICD-10-CM | POA: Insufficient documentation

## 2020-12-24 DIAGNOSIS — IMO0001 Reserved for inherently not codable concepts without codable children: Secondary | ICD-10-CM | POA: Insufficient documentation

## 2020-12-24 NOTE — Progress Notes (Signed)
Scheduled to complete physical 01/02/2021. Waiting for [facility] to send calendar with clinic coverage.  AMD

## 2020-12-25 ENCOUNTER — Other Ambulatory Visit: Payer: Self-pay

## 2020-12-25 ENCOUNTER — Ambulatory Visit: Payer: Self-pay

## 2020-12-25 DIAGNOSIS — Z0000 Encounter for general adult medical examination without abnormal findings: Secondary | ICD-10-CM

## 2020-12-25 LAB — POCT URINALYSIS DIPSTICK
Bilirubin, UA: NEGATIVE
Blood, UA: NEGATIVE
Glucose, UA: NEGATIVE
Ketones, UA: NEGATIVE
Leukocytes, UA: NEGATIVE
Nitrite, UA: NEGATIVE
Protein, UA: NEGATIVE
Spec Grav, UA: 1.02 (ref 1.010–1.025)
Urobilinogen, UA: 0.2 E.U./dL
pH, UA: 6 (ref 5.0–8.0)

## 2020-12-26 LAB — CMP12+LP+TP+TSH+6AC+CBC/D/PLT
ALT: 70 IU/L — ABNORMAL HIGH (ref 0–44)
AST: 28 IU/L (ref 0–40)
Albumin/Globulin Ratio: 2.2 (ref 1.2–2.2)
Albumin: 4.7 g/dL (ref 4.0–5.0)
Alkaline Phosphatase: 61 IU/L (ref 44–121)
BUN/Creatinine Ratio: 19 (ref 9–20)
BUN: 19 mg/dL (ref 6–20)
Basophils Absolute: 0.1 10*3/uL (ref 0.0–0.2)
Basos: 1 %
Bilirubin Total: 0.8 mg/dL (ref 0.0–1.2)
Calcium: 9.6 mg/dL (ref 8.7–10.2)
Chloride: 102 mmol/L (ref 96–106)
Chol/HDL Ratio: 5.1 ratio — ABNORMAL HIGH (ref 0.0–5.0)
Cholesterol, Total: 179 mg/dL (ref 100–199)
Creatinine, Ser: 0.99 mg/dL (ref 0.76–1.27)
EOS (ABSOLUTE): 0.2 10*3/uL (ref 0.0–0.4)
Eos: 6 %
Estimated CHD Risk: 1.1 times avg. — ABNORMAL HIGH (ref 0.0–1.0)
Free Thyroxine Index: 2.5 (ref 1.2–4.9)
GFR calc Af Amer: 113 mL/min/{1.73_m2} (ref >59)
GFR calc non Af Amer: 98 mL/min/{1.73_m2} (ref >59)
GGT: 9 IU/L (ref 0–65)
Globulin, Total: 2.1 g/dL (ref 1.5–4.5)
Glucose: 90 mg/dL (ref 65–99)
HDL: 35 mg/dL — ABNORMAL LOW (ref >39)
Hematocrit: 44.1 % (ref 37.5–51.0)
Hemoglobin: 14.5 g/dL (ref 13.0–17.7)
Immature Grans (Abs): 0 10*3/uL (ref 0.0–0.1)
Immature Granulocytes: 0 %
Iron: 96 ug/dL (ref 38–169)
LDH: 242 IU/L — ABNORMAL HIGH (ref 121–224)
LDL Chol Calc (NIH): 124 mg/dL — ABNORMAL HIGH (ref 0–99)
Lymphocytes Absolute: 1.1 10*3/uL (ref 0.7–3.1)
Lymphs: 27 %
MCH: 28.1 pg (ref 26.6–33.0)
MCHC: 32.9 g/dL (ref 31.5–35.7)
MCV: 86 fL (ref 79–97)
Monocytes Absolute: 0.4 10*3/uL (ref 0.1–0.9)
Monocytes: 9 %
Neutrophils Absolute: 2.4 10*3/uL (ref 1.4–7.0)
Neutrophils: 57 %
Phosphorus: 3.2 mg/dL (ref 2.8–4.1)
Platelets: 190 10*3/uL (ref 150–450)
Potassium: 4.4 mmol/L (ref 3.5–5.2)
RBC: 5.16 x10E6/uL (ref 4.14–5.80)
RDW: 12.7 % (ref 11.6–15.4)
Sodium: 141 mmol/L (ref 134–144)
T3 Uptake Ratio: 28 % (ref 24–39)
T4, Total: 8.9 ug/dL (ref 4.5–12.0)
TSH: 1.06 u[IU]/mL (ref 0.450–4.500)
Total Protein: 6.8 g/dL (ref 6.0–8.5)
Triglycerides: 110 mg/dL (ref 0–149)
Uric Acid: 5.3 mg/dL (ref 3.8–8.4)
VLDL Cholesterol Cal: 20 mg/dL (ref 5–40)
WBC: 4.1 10*3/uL (ref 3.4–10.8)

## 2021-01-02 ENCOUNTER — Other Ambulatory Visit: Payer: Self-pay

## 2021-01-02 ENCOUNTER — Ambulatory Visit: Payer: 59

## 2021-01-02 ENCOUNTER — Encounter: Payer: Self-pay | Admitting: Emergency Medicine

## 2021-01-02 ENCOUNTER — Ambulatory Visit: Payer: Self-pay | Admitting: Emergency Medicine

## 2021-01-02 VITALS — BP 119/76 | HR 101 | Temp 98.4°F | Resp 14 | Ht 72.0 in | Wt 278.0 lb

## 2021-01-02 DIAGNOSIS — Z0000 Encounter for general adult medical examination without abnormal findings: Secondary | ICD-10-CM

## 2021-01-02 NOTE — Progress Notes (Signed)
I have reviewed the triage vital signs and the nursing notes.   HISTORY  Chief Complaint Annual Exam   HPI Adrian Lang is a 35 y.o. male is here for annual physical.  He denies any difficulties at this time.  At the beginning of his physical epic was unavailable.      Past Medical History:  Diagnosis Date  . Arthritis    left ankle  . Elevated LDL cholesterol level   . GERD (gastroesophageal reflux disease)    can cause wheezing if gets bad  . Low back pain   . Obesity   . Rectal bleeding   . Sleep apnea    uses cpap.  has mild case of sleep apnea  . Wheezing    at night mostly    Patient Active Problem List   Diagnosis Date Noted  . Pain in joint, lower leg 12/24/2020  . Reason for consultation 12/24/2020  . Osteochondral defect of talus 04/27/2019  . Status post ORIF of fracture of ankle 10/07/2018  . Ankle fracture 07/28/2017  . Tear of lateral cartilage or meniscus of knee, current 03/14/2012  . Other personal history presenting hazards to health 11/03/2002    Past Surgical History:  Procedure Laterality Date  . ANKLE ARTHROSCOPY Left 10/06/2018   Procedure: ARTHROSCOPIC DEBRIDEMENT OF PAINFUL SCAR TISSUE FROM LEFT ANKLE RETAINED ORTHOPEDIC HARDWARE;  Surgeon: Christena Flake, MD;  Location: [facility];  Service: Orthopedics;  Laterality: Left;  SMALL JOINT ARTHROSCOPY SYSTEM WITH THE 2.9 MM FULL RADIUS RESECTOR  . ANKLE ARTHROSCOPY Left 04/27/2019   Procedure: ANKLE ARTHROSCOPY LEFT, WITH DEBRIDEMENT AND EXCISION OF OSTEOPHYTES;  Surgeon: Christena Flake, MD;  Location: [facility];  Service: Orthopedics;  Laterality: Left;  . HARDWARE REMOVAL Left 04/27/2019   Procedure: HARDWARE REMOVAL;  Surgeon: Christena Flake, MD;  Location: [facility];  Service: Orthopedics;  Laterality: Left;  . KNEE SURGERY Right 2012   knee scope  . ORIF ANKLE FRACTURE Left 07/29/2017   Procedure: OPEN REDUCTION INTERNAL FIXATION (ORIF) ANKLE FRACTURE;  Surgeon: Christena Flake,  MD;  Location: [facility];  Service: Orthopedics;  Laterality: Left;  . TONSILECTOMY, ADENOIDECTOMY, BILATERAL MYRINGOTOMY AND TUBES    . variceal vein repair Left    When a teenager    Prior to Admission medications   Medication Sig Start Date End Date Taking? Authorizing Provider  acetaminophen (TYLENOL) 500 MG tablet Take 500 mg by mouth every 6 (six) hours as needed.   Yes [provider]  albuterol (PROVENTIL HFA;VENTOLIN HFA) 108 (90 Base) MCG/ACT inhaler Inhale 2 puffs into the lungs every 6 (six) hours as needed for wheezing or shortness of breath. 01/11/19  Yes Erin Fulling, MD  fluticasone Aleda Grana) 50 MCG/ACT nasal spray  10/23/20  Yes [provider]  naproxen sodium (ALEVE) 220 MG tablet Take 220 mg by mouth 2 (two) times daily as needed (pain).   Yes [provider]    Allergies Patient has no known allergies.  Family History  Problem Relation Age of Onset  . Breast cancer Mother     Social History Social History   Tobacco Use  . Smoking status: Never Smoker  . Smokeless tobacco: Never Used  Vaping Use  . Vaping Use: Never used  Substance Use Topics  . Alcohol use: Yes    Comment: occasionally  . Drug use: Never    Review of Systems Constitutional: No fever/chills Eyes: No visual changes. Cardiovascular: Denies chest pain. Respiratory: Denies shortness  of breath. Gastrointestinal: No abdominal pain.  Musculoskeletal: Negative for musculoskeletal pain. Skin: Negative for rash. Neurological: Negative for headaches, focal weakness or numbness.  ____________________________________________   PHYSICAL EXAM: Constitutional: Alert and oriented. Well appearing and in no acute distress. Eyes: Conjunctivae are normal.  Head: Atraumatic. Nose: No congestion/rhinnorhea. Neck: No stridor. Cardiovascular: Normal rate, regular rhythm. Grossly normal heart sounds.  Good peripheral circulation. Respiratory: Normal respiratory effort.  No  retractions. Lungs CTAB. Musculoskeletal: Moves upper and lower extremities without any difficulties.  Normal gait was noted. Neurologic:  Normal speech and language. No gross focal neurologic deficits are appreciated. No gait instability. Skin:  Skin is warm, dry and intact. No rash noted. Psychiatric: Mood and affect are normal. Speech and behavior are normal.  ____________________________________________   LABS (all labs ordered are listed, but only abnormal results are displayed)  Labs were discussed with [facility] papers being printed off. ____________________________________________  PROCEDURES  Procedure(s) performed (including Critical Care):  Procedures ____________________________________________   FINAL CLINICAL IMPRESSION(S) / ED DIAGNOSES  Annual physical exam  Lab work was discussed with patient and he was made aware that there were several labs that were elevated concerning his lipid panel.  We will watch to see if this continues to elevate and possibly treat with statins in the future.  Encouraged him to return to making better choices for meals and increase exercise.     ED Discharge Orders    None       Note:  This document was prepared using Dragon voice recognition software and may include unintentional dictation errors.

## 2021-01-14 DIAGNOSIS — G4733 Obstructive sleep apnea (adult) (pediatric): Secondary | ICD-10-CM | POA: Diagnosis not present

## 2021-04-04 ENCOUNTER — Other Ambulatory Visit: Payer: Self-pay | Admitting: Adult Health

## 2021-04-04 MED ORDER — GUAIFENESIN-CODEINE 100-10 MG/5ML PO SYRP: 5.0000 mL | ORAL_SOLUTION | Freq: Two times a day (BID) | ORAL | 0 refills | Status: DC | PRN

## 2021-04-04 NOTE — Progress Notes (Signed)
Patient is positive for Covid, and is coughing.  Requesting some cough medication. RX for guiaf/codeine sent to pharmacy.

## 2021-04-15 DIAGNOSIS — G4733 Obstructive sleep apnea (adult) (pediatric): Secondary | ICD-10-CM | POA: Diagnosis not present

## 2021-05-15 DIAGNOSIS — G4733 Obstructive sleep apnea (adult) (pediatric): Secondary | ICD-10-CM | POA: Diagnosis not present

## 2021-06-15 DIAGNOSIS — G4733 Obstructive sleep apnea (adult) (pediatric): Secondary | ICD-10-CM | POA: Diagnosis not present

## 2021-07-17 DIAGNOSIS — G4733 Obstructive sleep apnea (adult) (pediatric): Secondary | ICD-10-CM | POA: Diagnosis not present

## 2021-09-20 ENCOUNTER — Ambulatory Visit: Payer: Self-pay

## 2021-09-20 ENCOUNTER — Other Ambulatory Visit: Payer: Self-pay

## 2021-09-20 DIAGNOSIS — Z0000 Encounter for general adult medical examination without abnormal findings: Secondary | ICD-10-CM

## 2021-09-20 LAB — POCT URINALYSIS DIPSTICK
Bilirubin, UA: NEGATIVE
Blood, UA: NEGATIVE
Glucose, UA: NEGATIVE
Ketones, UA: NEGATIVE
Leukocytes, UA: NEGATIVE
Nitrite, UA: NEGATIVE
Protein, UA: NEGATIVE
Spec Grav, UA: 1.02 (ref 1.010–1.025)
Urobilinogen, UA: 0.2 E.U./dL
pH, UA: 6 (ref 5.0–8.0)

## 2021-09-21 LAB — CMP12+LP+TP+TSH+6AC+CBC/D/PLT
ALT: 26 IU/L (ref 0–44)
AST: 17 IU/L (ref 0–40)
Albumin/Globulin Ratio: 2.2 (ref 1.2–2.2)
Albumin: 4.9 g/dL (ref 4.0–5.0)
Alkaline Phosphatase: 67 IU/L (ref 44–121)
BUN/Creatinine Ratio: 18 (ref 9–20)
BUN: 19 mg/dL (ref 6–20)
Basophils Absolute: 0.1 10*3/uL (ref 0.0–0.2)
Basos: 1 %
Bilirubin Total: 1.3 mg/dL — ABNORMAL HIGH (ref 0.0–1.2)
Calcium: 9.5 mg/dL (ref 8.7–10.2)
Chloride: 98 mmol/L (ref 96–106)
Chol/HDL Ratio: 4.1 ratio (ref 0.0–5.0)
Cholesterol, Total: 178 mg/dL (ref 100–199)
Creatinine, Ser: 1.04 mg/dL (ref 0.76–1.27)
EOS (ABSOLUTE): 0.4 10*3/uL (ref 0.0–0.4)
Eos: 6 %
Estimated CHD Risk: 0.8 times avg. (ref 0.0–1.0)
Free Thyroxine Index: 2.6 (ref 1.2–4.9)
GGT: 6 IU/L (ref 0–65)
Globulin, Total: 2.2 g/dL (ref 1.5–4.5)
Glucose: 100 mg/dL — ABNORMAL HIGH (ref 70–99)
HDL: 43 mg/dL (ref >39)
Hematocrit: 43.9 % (ref 37.5–51.0)
Hemoglobin: 15.2 g/dL (ref 13.0–17.7)
Immature Grans (Abs): 0 10*3/uL (ref 0.0–0.1)
Immature Granulocytes: 0 %
Iron: 112 ug/dL (ref 38–169)
LDH: 169 IU/L (ref 121–224)
LDL Chol Calc (NIH): 117 mg/dL — ABNORMAL HIGH (ref 0–99)
Lymphocytes Absolute: 1.3 10*3/uL (ref 0.7–3.1)
Lymphs: 22 %
MCH: 28.8 pg (ref 26.6–33.0)
MCHC: 34.6 g/dL (ref 31.5–35.7)
MCV: 83 fL (ref 79–97)
Monocytes Absolute: 0.8 10*3/uL (ref 0.1–0.9)
Monocytes: 12 %
Neutrophils Absolute: 3.5 10*3/uL (ref 1.4–7.0)
Neutrophils: 59 %
Phosphorus: 4 mg/dL (ref 2.8–4.1)
Platelets: 184 10*3/uL (ref 150–450)
Potassium: 4.4 mmol/L (ref 3.5–5.2)
RBC: 5.27 x10E6/uL (ref 4.14–5.80)
RDW: 12.4 % (ref 11.6–15.4)
Sodium: 141 mmol/L (ref 134–144)
T3 Uptake Ratio: 30 % (ref 24–39)
T4, Total: 8.6 ug/dL (ref 4.5–12.0)
TSH: 1.32 u[IU]/mL (ref 0.450–4.500)
Total Protein: 7.1 g/dL (ref 6.0–8.5)
Triglycerides: 96 mg/dL (ref 0–149)
Uric Acid: 5.2 mg/dL (ref 3.8–8.4)
VLDL Cholesterol Cal: 18 mg/dL (ref 5–40)
WBC: 6.1 10*3/uL (ref 3.4–10.8)
eGFR: 95 mL/min/{1.73_m2} (ref >59)

## 2021-09-24 ENCOUNTER — Ambulatory Visit: Payer: Self-pay | Admitting: Physician Assistant

## 2021-09-24 ENCOUNTER — Other Ambulatory Visit: Payer: Self-pay

## 2021-09-24 ENCOUNTER — Encounter: Payer: Self-pay | Admitting: Physician Assistant

## 2021-09-24 VITALS — BP 129/80 | HR 68 | Temp 97.6°F | Resp 14 | Ht 72.0 in | Wt 234.0 lb

## 2021-09-24 DIAGNOSIS — Z0000 Encounter for general adult medical examination without abnormal findings: Secondary | ICD-10-CM

## 2021-09-24 MED ORDER — ORPHENADRINE CITRATE ER 100 MG PO TB12: 100.0000 mg | ORAL_TABLET | Freq: Two times a day (BID) | ORAL | 1 refills | Status: DC

## 2021-09-24 MED ORDER — NAPROXEN 500 MG PO TABS: 500.0000 mg | ORAL_TABLET | Freq: Two times a day (BID) | ORAL | 1 refills | Status: DC

## 2021-09-24 NOTE — Progress Notes (Signed)
   Subjective: Annual physical exam    Patient ID: Adrian Lang, male    DOB: 04-09-1986, 35 y.o.   MRN: [id number]  HPI Patient presents annual physical exam.  Patient was concerned for intermitting right lateral back pain.  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  Patient denies radicular component to his back pain.  Patient denies bladder or bowel dysfunction.  Patient state when he has back pain last for 3 to 5 days before he is back to his baseline.  Patient has tried chiropractor with only mild relief.  Patient also state he is lost approximately 40 pounds in the past year through diet and exercise.   Review of Systems Allergic rhinitis and asthma.    Objective:   Physical Exam No acute distress.  Temperature 97.6, pulse 68, respiration 14, BP is 129/80, and patient 1% O2 sat on room air.  Patient weighs 234 pounds and BMI is 31.7. HEENT is unremarkable.  Neck is supple without lymphadenopathy or bruits.  Lungs are clear to auscultation.  Heart regular rate and rhythm. Abdomen with negative HSM, normoactive bowel sounds, soft, nontender to palpation. No obvious deformity to the upper or lower extremities.  Patient has full and equal range of motion of the upper and lower extremities. No obvious cervical or lumbar spine deformity.  Patient has full and equal range of motion of the cervical lumbar spine. Cranial nerves II through XII are grossly intact.  DTR 2+ without clonus.      Assessment & Plan:   Discussed lab results with patient.  Recommend wearing elastic lumbar support while working.  Patient also given prescription for left for naproxen and Norflex to use for flareup of back pain.

## 2021-09-24 NOTE — Progress Notes (Signed)
Pt states he's been having lower back issues for pro-longed time. He's been going to chiropractor and its getting to the point where its not helping the flair up's he keeps having. Pt wondering is there anything else to do for the back pain.

## 2021-10-15 DIAGNOSIS — G4733 Obstructive sleep apnea (adult) (pediatric): Secondary | ICD-10-CM | POA: Diagnosis not present

## 2022-01-13 DIAGNOSIS — G4733 Obstructive sleep apnea (adult) (pediatric): Secondary | ICD-10-CM | POA: Diagnosis not present

## 2022-02-13 DIAGNOSIS — G4733 Obstructive sleep apnea (adult) (pediatric): Secondary | ICD-10-CM | POA: Diagnosis not present

## 2022-03-05 ENCOUNTER — Ambulatory Visit: Payer: Self-pay | Admitting: Physician Assistant

## 2022-03-05 ENCOUNTER — Encounter: Payer: Self-pay | Admitting: Physician Assistant

## 2022-03-05 VITALS — BP 119/71 | HR 91 | Temp 97.8°F | Resp 14 | Ht 71.75 in | Wt 235.0 lb

## 2022-03-05 DIAGNOSIS — R519 Headache, unspecified: Secondary | ICD-10-CM

## 2022-03-05 MED ORDER — KETOROLAC TROMETHAMINE 30 MG/ML IJ SOLN
30.0000 mg | Freq: Once | INTRAMUSCULAR | Status: AC
  Administered 2022-03-05: 30 mg via INTRAMUSCULAR

## 2022-03-05 NOTE — Progress Notes (Signed)
Scheduled to see Dr. Brooke Dare of [facility] on ?03/06/2022 at 1:30 pm. ? ?Nishanth award of date/time & location. ? ?AMD ?

## 2022-03-05 NOTE — Progress Notes (Signed)
Aleve, tylenol, BC powder and Claritin over the past 4 days for his head ache and nothing is helping. Pt does state while working out his head stops hurting but when stopping it comes back. ?

## 2022-03-05 NOTE — Progress Notes (Signed)
? ?  Subjective:  ? ? Patient ID: Adrian Lang, male    DOB: Aug 16, 1986, 36 y.o.   MRN: [id number] ? 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 ?Long hx of tinnitus , wax and wane, bilateral ?Hx of seasonal allergies-- does not treat routinely  ? ?Review of Systems ?As noted above ?Right lid lag noted ?Sparkles in vision right eye ?Eye: B 20/18    R 20/15   L 20/22 ?Hx of seasonal allergies  ? ?   ?Objective:  ? Physical Exam ?Vitals and nursing note reviewed.  ?Constitutional:   ?   Appearance: He is well-developed.  ?   Comments: Overweight, but down from high 260 plus ?Watching diet ?Loves to run  for exercise- feels good ?Hx TMJ x years ?Uses CPAP with mouth guard at night- " now I can't grind teeth" ?  ?HENT:  ?   Head: Normocephalic and atraumatic.  ?   Comments: Ears grossly WNL, canals clear , TMs with clear bullous  ?   Mouth/Throat:  ?   Mouth: Mucous membranes are moist.  ?Eyes:  ?   General: No visual field deficit. ?   Extraocular Movements: Extraocular movements intact.  ?   Right eye: Normal extraocular motion and no nystagmus.  ?   Left eye: Normal extraocular motion and no nystagmus.  ?   Pupils: Pupils are equal, round, and reactive to light.  ?   Comments: Right lid lag mild  ?Neck:  ?   Comments: Trapezius tension by palpation   FROM head/neck/shoulders no weakness ?Pulmonary:  ?   Effort: Pulmonary effort is normal.  ?Musculoskeletal:  ?   Cervical back: Normal range of motion and neck supple.  ?Skin: ?   General: Skin is warm and dry.  ?Neurological:  ?   Mental Status: He is alert and oriented to person, place, and time.  ?   Cranial Nerves: No facial asymmetry.  ?   Motor: No weakness.  ?   Gait: Gait normal.  ?Psychiatric:     ?   Mood and Affect: Mood normal.     ?   Speech: Speech normal.     ?   Behavior: Behavior normal.  ?   Comments: Concerned, stressed situational, mildly anxious  ? ? ? ?   ?Assessment & Plan:  ?Right  sided Headache ? 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 ?Request follow up contact after consult ? ? ?

## 2022-03-15 DIAGNOSIS — G4733 Obstructive sleep apnea (adult) (pediatric): Secondary | ICD-10-CM | POA: Diagnosis not present

## 2022-03-20 NOTE — Progress Notes (Signed)
Anette Riedel, PA-C requested a note to be added to Adrian Lang's chart.  [name] called the next day (03/06/2022) reporting he felt better & that he cancelled the appt scheduled with Dr. Brooke Dare at [facility].  States he plans to go to [name] at a later date.  AMD

## 2022-07-10 ENCOUNTER — Other Ambulatory Visit: Payer: Self-pay

## 2022-07-11 MED ORDER — NAPROXEN SODIUM 220 MG PO TABS: 220.0000 mg | ORAL_TABLET | Freq: Two times a day (BID) | ORAL | 0 refills | Status: AC | PRN

## 2022-07-11 MED ORDER — ORPHENADRINE CITRATE ER 100 MG PO TB12: 100.0000 mg | ORAL_TABLET | Freq: Two times a day (BID) | ORAL | 1 refills | Status: DC

## 2022-09-03 ENCOUNTER — Other Ambulatory Visit: Payer: Self-pay

## 2022-09-03 DIAGNOSIS — Z0283 Encounter for blood-alcohol and blood-drug test: Secondary | ICD-10-CM

## 2022-09-03 NOTE — Progress Notes (Signed)
Presents to [facility] for on-site random drug screen & breath alcohol screen.  [facility] Acct #:  [id number] [facility] Specimen #:  [id number]  Breath Alcohol Results = .000  AMD

## 2022-10-03 ENCOUNTER — Ambulatory Visit: Payer: Self-pay

## 2022-10-03 DIAGNOSIS — Z23 Encounter for immunization: Secondary | ICD-10-CM

## 2022-10-03 NOTE — Progress Notes (Signed)
Annual flu vaccine administered after consent and tolerated well in clinic.

## 2022-10-09 DIAGNOSIS — G4733 Obstructive sleep apnea (adult) (pediatric): Secondary | ICD-10-CM | POA: Diagnosis not present

## 2022-10-10 ENCOUNTER — Ambulatory Visit: Payer: Self-pay

## 2022-10-10 DIAGNOSIS — R7989 Other specified abnormal findings of blood chemistry: Secondary | ICD-10-CM | POA: Insufficient documentation

## 2022-10-10 DIAGNOSIS — Z0000 Encounter for general adult medical examination without abnormal findings: Secondary | ICD-10-CM

## 2022-10-10 LAB — POCT URINALYSIS DIPSTICK
Bilirubin, UA: NEGATIVE
Blood, UA: NEGATIVE
Glucose, UA: NEGATIVE
Ketones, UA: NEGATIVE
Leukocytes, UA: NEGATIVE
Nitrite, UA: NEGATIVE
Protein, UA: NEGATIVE
Spec Grav, UA: 1.01 (ref 1.010–1.025)
Urobilinogen, UA: 0.2 E.U./dL
pH, UA: 6 (ref 5.0–8.0)

## 2022-10-10 NOTE — Progress Notes (Signed)
Pt presents today for physical labs, will return to clinic for scheduled physical./CL,RMA 

## 2022-10-15 ENCOUNTER — Encounter: Payer: Self-pay | Admitting: Physician Assistant

## 2022-10-15 ENCOUNTER — Ambulatory Visit: Payer: Self-pay | Admitting: Physician Assistant

## 2022-10-15 VITALS — BP 119/82 | HR 70 | Resp 14 | Ht 71.75 in | Wt 233.0 lb

## 2022-10-15 DIAGNOSIS — R7989 Other specified abnormal findings of blood chemistry: Secondary | ICD-10-CM

## 2022-10-15 DIAGNOSIS — M5417 Radiculopathy, lumbosacral region: Secondary | ICD-10-CM

## 2022-10-15 DIAGNOSIS — R6882 Decreased libido: Secondary | ICD-10-CM

## 2022-10-15 LAB — CMP12+LP+TP+TSH+6AC+PSA+CBC…
ALT: 23 IU/L (ref 0–44)
AST: 13 IU/L (ref 0–40)
Albumin/Globulin Ratio: 2.6 — ABNORMAL HIGH (ref 1.2–2.2)
Albumin: 5 g/dL (ref 4.1–5.1)
Alkaline Phosphatase: 59 IU/L (ref 44–121)
BUN/Creatinine Ratio: 18 (ref 9–20)
BUN: 18 mg/dL (ref 6–20)
Basophils Absolute: 0.1 10*3/uL (ref 0.0–0.2)
Basos: 1 %
Bilirubin Total: 0.9 mg/dL (ref 0.0–1.2)
Calcium: 9.7 mg/dL (ref 8.7–10.2)
Chloride: 102 mmol/L (ref 96–106)
Chol/HDL Ratio: 3.9 ratio (ref 0.0–5.0)
Cholesterol, Total: 178 mg/dL (ref 100–199)
Creatinine, Ser: 1.01 mg/dL (ref 0.76–1.27)
EOS (ABSOLUTE): 0.3 10*3/uL (ref 0.0–0.4)
Eos: 6 %
Estimated CHD Risk: 0.7 times avg. (ref 0.0–1.0)
Free Thyroxine Index: 2.2 (ref 1.2–4.9)
GGT: 6 IU/L (ref 0–65)
Globulin, Total: 1.9 g/dL (ref 1.5–4.5)
Glucose: 98 mg/dL (ref 70–99)
HDL: 46 mg/dL (ref >39)
Hematocrit: 42.5 % (ref 37.5–51.0)
Hemoglobin: 14.3 g/dL (ref 13.0–17.7)
Immature Grans (Abs): 0 10*3/uL (ref 0.0–0.1)
Immature Granulocytes: 0 %
Iron: 103 ug/dL (ref 38–169)
LDH: 169 IU/L (ref 121–224)
LDL Chol Calc (NIH): 119 mg/dL — ABNORMAL HIGH (ref 0–99)
Lymphocytes Absolute: 1.2 10*3/uL (ref 0.7–3.1)
Lymphs: 25 %
MCH: 28.7 pg (ref 26.6–33.0)
MCHC: 33.6 g/dL (ref 31.5–35.7)
MCV: 85 fL (ref 79–97)
Monocytes Absolute: 0.5 10*3/uL (ref 0.1–0.9)
Monocytes: 10 %
Neutrophils Absolute: 2.7 10*3/uL (ref 1.4–7.0)
Neutrophils: 58 %
Phosphorus: 3.4 mg/dL (ref 2.8–4.1)
Platelets: 174 10*3/uL (ref 150–450)
Potassium: 4.7 mmol/L (ref 3.5–5.2)
Prostate Specific Ag, Serum: 1.4 ng/mL (ref 0.0–4.0)
RBC: 4.98 x10E6/uL (ref 4.14–5.80)
RDW: 12 % (ref 11.6–15.4)
Sodium: 141 mmol/L (ref 134–144)
T3 Uptake Ratio: 29 % (ref 24–39)
T4, Total: 7.7 ug/dL (ref 4.5–12.0)
TSH: 1.17 u[IU]/mL (ref 0.450–4.500)
Total Protein: 6.9 g/dL (ref 6.0–8.5)
Triglycerides: 66 mg/dL (ref 0–149)
Uric Acid: 4.4 mg/dL (ref 3.8–8.4)
VLDL Cholesterol Cal: 13 mg/dL (ref 5–40)
WBC: 4.7 10*3/uL (ref 3.4–10.8)
eGFR: 98 mL/min/{1.73_m2} (ref >59)

## 2022-10-15 LAB — TESTOSTERONE,FREE AND TOTAL
Testosterone, Free: 7.2 pg/mL — ABNORMAL LOW (ref 8.7–25.1)
Testosterone: 227 ng/dL — ABNORMAL LOW (ref 264–916)

## 2022-10-15 NOTE — Progress Notes (Signed)
Pt presents to complete physical. Pt has a lump on Lft  lower abd. Pt noticed it after losing weight and gets sore when doing ab work outs.

## 2022-10-15 NOTE — Progress Notes (Signed)
[facility] occupational health clinic   ____________________________________________   None    (approximate)  I have reviewed the triage vital signs and the nursing notes.   HISTORY  Chief Complaint Annual Exam  HPI Adrian Lang is a 36 y.o. male patient presents for annual physical exam.  Patient complain of intermitting radicular pain to the lower extremity which increased with cough or sneezing.  Denies bladder or bowel dysfunction.  Patient also states decreased libido and increased fatigue.         Past Medical History:  Diagnosis Date   Arthritis    left ankle   Elevated LDL cholesterol level    GERD (gastroesophageal reflux disease)    can cause wheezing if gets bad   Low back pain    Obesity    Rectal bleeding    Sleep apnea    uses cpap.  has mild case of sleep apnea   Wheezing    at night mostly    Patient Active Problem List   Diagnosis Date Noted   Pain in joint, lower leg 12/24/2020   Reason for consultation 12/24/2020   Osteochondral defect of talus 04/27/2019   Status post ORIF of fracture of ankle 10/07/2018   Ankle fracture 07/28/2017   Tear of lateral cartilage or meniscus of knee, current 03/14/2012   Other personal history presenting hazards to health 11/03/2002    Past Surgical History:  Procedure Laterality Date   ANKLE ARTHROSCOPY Left 10/06/2018   Procedure: ARTHROSCOPIC DEBRIDEMENT OF PAINFUL SCAR TISSUE FROM LEFT ANKLE RETAINED ORTHOPEDIC HARDWARE;  Surgeon: Christena Flake, MD;  Location: [facility];  Service: Orthopedics;  Laterality: Left;  SMALL JOINT ARTHROSCOPY SYSTEM WITH THE 2.9 MM FULL RADIUS RESECTOR   ANKLE ARTHROSCOPY Left 04/27/2019   Procedure: ANKLE ARTHROSCOPY LEFT, WITH DEBRIDEMENT AND EXCISION OF OSTEOPHYTES;  Surgeon: Christena Flake, MD;  Location: [facility];  Service: Orthopedics;  Laterality: Left;   HARDWARE REMOVAL Left 04/27/2019   Procedure: HARDWARE REMOVAL;  Surgeon: Christena Flake, MD;   Location: [facility];  Service: Orthopedics;  Laterality: Left;   KNEE SURGERY Right 2012   knee scope   ORIF ANKLE FRACTURE Left 07/29/2017   Procedure: OPEN REDUCTION INTERNAL FIXATION (ORIF) ANKLE FRACTURE;  Surgeon: Christena Flake, MD;  Location: [facility];  Service: Orthopedics;  Laterality: Left;   TONSILECTOMY, ADENOIDECTOMY, BILATERAL MYRINGOTOMY AND TUBES     variceal vein repair Left    When a teenager    Prior to Admission medications   Medication Sig Start Date End Date Taking? Authorizing Provider  acetaminophen (TYLENOL) 500 MG tablet Take 500 mg by mouth every 6 (six) hours as needed.   Yes [provider]  albuterol (PROVENTIL HFA;VENTOLIN HFA) 108 (90 Base) MCG/ACT inhaler Inhale 2 puffs into the lungs every 6 (six) hours as needed for wheezing or shortness of breath. 01/11/19  Yes Erin Fulling, MD  fluticasone Aleda Grana) 50 MCG/ACT nasal spray  10/23/20  Yes [provider]  orphenadrine (NORFLEX) 100 MG tablet Take 1 tablet (100 mg total) by mouth 2 (two) times daily. 07/11/22  Yes Joni Reining, PA-C  triamcinolone cream (KENALOG) 0.1 % Apply topically daily. 07/12/21  Yes [provider]    Allergies Patient has no known allergies.  Family History  Problem Relation Age of Onset   Breast cancer Mother     Social History Social History   Tobacco Use   Smoking status: Never   Smokeless tobacco: Never  Vaping Use   Vaping Use: Never used  Substance Use Topics   Alcohol use: Yes    Comment: occasionally   Drug use: Never    Review of Systems Constitutional: No fever/chills.  Increased fatigue Eyes: No visual changes. ENT: No sore throat. Cardiovascular: Denies chest pain. Respiratory: Denies shortness of breath. Gastrointestinal: No abdominal pain.  No nausea, no vomiting.  No diarrhea.  No constipation. Genitourinary: Negative for dysuria.  Decreased libido Musculoskeletal: Positive for back pain. Skin: Negative for  rash. Neurological: Negative for headaches, focal weakness or numbness.   ____________________________________________   PHYSICAL EXAM:  VITAL SIGNS: BP is 119/82, pulse 70, respiration 14, patient 90% O2 sat on room air.  Patient weighs 233 pounds and BMI is 31.82. Constitutional: Alert and oriented. Well appearing and in no acute distress. Eyes: Conjunctivae are normal. PERRL. EOMI. Head: Atraumatic. Nose: No congestion/rhinnorhea. Mouth/Throat: Mucous membranes are moist.  Oropharynx non-erythematous. Neck: No stridor.  No cervical spine tenderness to palpation. Hematological/Lymphatic/Immunilogical: No cervical lymphadenopathy. Cardiovascular: Normal rate, regular rhythm. Grossly normal heart sounds.  Good peripheral circulation. Respiratory: Normal respiratory effort.  No retractions. Lungs CTAB. Gastrointestinal: Soft and nontender. No distention. No abdominal bruits. No CVA tenderness. Genitourinary: Deferred Musculoskeletal: No lower extremity tenderness nor edema.  No joint effusions. Neurologic:  Normal speech and language. No gross focal neurologic deficits are appreciated. No gait instability. Skin:  Skin is warm, dry and intact. No rash noted. Psychiatric: Mood and affect are normal. Speech and behavior are normal.  ____________________________________________   LABS Lab results reveal testosterone at 227.  ____________________________________________   ____________________________________________  ____________________________________________   INITIAL IMPRESSION / ASSESSMENT AND PLAN / ED COURSE  As part of my medical decision making, I reviewed the following data within the electronic MEDICAL RECORD NUMBER         Discussed lab results with patient.  Consult to urology will be generated secondary to complaint of fatigue, decreased libido, and low and low testosterone level.  Further evaluation of radicular back pain with lumbar spine  x-ray.     ____________________________________________   FINAL CLINICAL IMPRESSION  Well exam   ED Discharge Orders     None        Note:  This document was prepared using Dragon voice recognition software and may include unintentional dictation errors.

## 2022-10-15 NOTE — Addendum Note (Signed)
Addended by: Gardner Candle on: 10/15/2022 04:48 PM   Modules accepted: Orders

## 2022-11-09 DIAGNOSIS — G4733 Obstructive sleep apnea (adult) (pediatric): Secondary | ICD-10-CM | POA: Diagnosis not present

## 2022-11-14 ENCOUNTER — Encounter: Payer: Self-pay | Admitting: Urology

## 2022-11-14 ENCOUNTER — Ambulatory Visit (INDEPENDENT_AMBULATORY_CARE_PROVIDER_SITE_OTHER): Payer: 59 | Admitting: Urology

## 2022-11-14 VITALS — BP 119/76 | HR 62 | Ht 71.5 in | Wt 225.0 lb

## 2022-11-14 DIAGNOSIS — E291 Testicular hypofunction: Secondary | ICD-10-CM

## 2022-11-14 NOTE — Progress Notes (Unsigned)
11/14/2022 9:37 AM   Adrian Lang 1985-12-10 [id number]  Referring provider: Sable Feil, PA-C [address] [place],  [place] [address]  Chief Complaint  Patient presents with   Hypogonadism    HPI: Adrian Lang is a 37 y.o. male referred for evaluation of hypogonadism.  Seen occupational health clinic 10/15/2022 for annual exam and had complaints of decreased libido and increased fatigue Testosterone level low at 227 ng/dL For at least a year and he has noted increased lethargy.  Will have some mental cloudiness at times and irritability.  No ED He has 4 children and family is complete Underwent varicocele repair at age 81 for pain   PMH: Past Medical History:  Diagnosis Date   Arthritis    left ankle   Elevated LDL cholesterol level    GERD (gastroesophageal reflux disease)    can cause wheezing if gets bad   Low back pain    Obesity    Rectal bleeding    Sleep apnea    uses cpap.  has mild case of sleep apnea   Wheezing    at night mostly    Surgical History: Past Surgical History:  Procedure Laterality Date   ANKLE ARTHROSCOPY Left 10/06/2018   Procedure: ARTHROSCOPIC DEBRIDEMENT OF PAINFUL SCAR TISSUE FROM LEFT ANKLE RETAINED ORTHOPEDIC HARDWARE;  Surgeon: Corky Mull, MD;  Location: [place];  Service: Orthopedics;  Laterality: Left;  SMALL JOINT ARTHROSCOPY SYSTEM WITH THE 2.9 MM FULL RADIUS RESECTOR   ANKLE ARTHROSCOPY Left 04/27/2019   Procedure: ANKLE ARTHROSCOPY LEFT, WITH DEBRIDEMENT AND EXCISION OF OSTEOPHYTES;  Surgeon: Corky Mull, MD;  Location: [facility];  Service: Orthopedics;  Laterality: Left;   HARDWARE REMOVAL Left 04/27/2019   Procedure: HARDWARE REMOVAL;  Surgeon: Corky Mull, MD;  Location: [facility];  Service: Orthopedics;  Laterality: Left;   KNEE SURGERY Right 2012   knee scope   ORIF ANKLE FRACTURE Left 07/29/2017   Procedure: OPEN REDUCTION INTERNAL FIXATION (ORIF) ANKLE FRACTURE;  Surgeon:  Corky Mull, MD;  Location: [facility];  Service: Orthopedics;  Laterality: Left;   TONSILECTOMY, ADENOIDECTOMY, BILATERAL MYRINGOTOMY AND TUBES     variceal vein repair Left    When a teenager    Home Medications:  Allergies as of 11/14/2022   No Known Allergies      Medication List        Accurate as of November 14, 2022  9:37 AM. If you have any questions, ask your nurse or doctor.          acetaminophen 500 MG tablet Commonly known as: TYLENOL Take 500 mg by mouth every 6 (six) hours as needed.   albuterol 108 (90 Base) MCG/ACT inhaler Commonly known as: VENTOLIN HFA Inhale 2 puffs into the lungs every 6 (six) hours as needed for wheezing or shortness of breath.   fluticasone 50 MCG/ACT nasal spray Commonly known as: FLONASE   orphenadrine 100 MG tablet Commonly known as: NORFLEX Take 1 tablet (100 mg total) by mouth 2 (two) times daily.   triamcinolone cream 0.1 % Commonly known as: KENALOG Apply topically daily.        Allergies: No Known Allergies  Family History: Family History  Problem Relation Age of Onset   Breast cancer Mother     Social History:  reports that he has never smoked. He has never used smokeless tobacco. He reports current alcohol use. He reports that he does not use drugs.   Physical  Exam: BP 119/76   Pulse 62   Ht 5' 11.5" (1.816 m)   Wt 225 lb (102.1 kg)   BMI 30.94 kg/m   Constitutional:  Alert and oriented, No acute distress. HEENT: [place] AT Respiratory: Normal respiratory effort, no increased work of breathing. GU: Phallus circumcised without lesions.  Testes descended bilaterally without masses or tenderness.  Estimated volume 20 cc bilaterally.  Spermatic cord/epididymis palpably normal bilaterally. Psychiatric: Normal mood and affect.   Assessment & Plan:    1.  Hypogonadism Bothersome symptoms We discussed the diagnosis of testosterone is based on 2 abnormal total testosterone levels drawn in the a.m. admitted  with signs and symptoms of low testosterone. We discussed various forms of testosterone placement including topical preparations, intramuscular injections, subcutaneous injections, subcutaneous pellet implantation and oral testosterone.  Pros and cons of each form were discussed.  The risk of transference of topical testosterone.  We also discussed the use of Clomid if his LH was mid range low-normal.  We discussed this is used off label and not covered by insurance. I had an extensive discussion regarding testosterone replacement therapy including the following: Treatment may result in improvements in erectile function, low sex drive, anemia, bone mineral density, lean body mass, and depressive symptoms; evidence is inconclusive whether testosterone therapy improves cognitive function, measures of diabetes, energy, fatigue, lipid profiles, and quality of life measures; there is no conclusive evidence linking testosterone therapy to the development of prostate cancer; there is no definitive evidence linking testosterone therapy to a higher incidence of venothrombolic events; at the present time it cannot be stated definitively whether testosterone therapy increases or decreases the risk of cardiovascular events including myocardial infarction and stroke. Potential side effects were discussed including erythrocytosis, gynecomastia.  The need for regular monitoring of testosterone levels and hematocrit was discussed. Repeat testosterone, LH and prolactin drawn today Will contact with results and he will think over preferred method of replacement   Abbie Sons, MD  [facility] [address] [place], [place] [address] [phone]

## 2022-11-15 LAB — PROLACTIN: Prolactin: 4 ng/mL (ref 3.9–22.7)

## 2022-11-15 LAB — TESTOSTERONE: Testosterone: 168 ng/dL — ABNORMAL LOW (ref 264–916)

## 2022-11-15 LAB — LUTEINIZING HORMONE: LH: 3.3 m[IU]/mL (ref 1.7–8.6)

## 2022-11-16 ENCOUNTER — Encounter: Payer: Self-pay | Admitting: Urology

## 2022-11-17 ENCOUNTER — Other Ambulatory Visit: Payer: Self-pay | Admitting: Urology

## 2022-11-17 MED ORDER — TESTOSTERONE 20.25 MG/ACT (1.62%) TD GEL: TRANSDERMAL | 1 refills | Status: DC

## 2022-11-18 ENCOUNTER — Other Ambulatory Visit: Payer: Self-pay | Admitting: Urology

## 2022-11-18 MED ORDER — TESTOSTERONE 20.25 MG/ACT (1.62%) TD GEL: TRANSDERMAL | 1 refills | Status: DC

## 2022-11-25 ENCOUNTER — Telehealth: Payer: Self-pay | Admitting: Family Medicine

## 2022-11-25 DIAGNOSIS — E291 Testicular hypofunction: Secondary | ICD-10-CM

## 2022-11-25 NOTE — Telephone Encounter (Signed)
Spoke to patient and he has started Testosterone gel. I have scheduled him for labs in March.

## 2022-12-10 ENCOUNTER — Other Ambulatory Visit: Payer: Self-pay | Admitting: *Deleted

## 2022-12-10 DIAGNOSIS — G4733 Obstructive sleep apnea (adult) (pediatric): Secondary | ICD-10-CM | POA: Diagnosis not present

## 2022-12-10 MED ORDER — TESTOSTERONE 20.25 MG/ACT (1.62%) TD GEL: TRANSDERMAL | 1 refills | Status: DC

## 2023-01-05 ENCOUNTER — Other Ambulatory Visit: Payer: 59

## 2023-01-05 DIAGNOSIS — E291 Testicular hypofunction: Secondary | ICD-10-CM

## 2023-01-06 LAB — TESTOSTERONE: Testosterone: 496 ng/dL (ref 264–916)

## 2023-01-16 ENCOUNTER — Encounter: Payer: Self-pay | Admitting: Urology

## 2023-01-18 ENCOUNTER — Other Ambulatory Visit: Payer: Self-pay | Admitting: Urology

## 2023-01-18 MED ORDER — TESTOSTERONE 20.25 MG/ACT (1.62%) TD GEL: TRANSDERMAL | 3 refills | Status: DC

## 2023-01-19 ENCOUNTER — Telehealth: Payer: Self-pay | Admitting: *Deleted

## 2023-01-19 NOTE — Telephone Encounter (Signed)
Notified patient as instructed, patient pleased. Discussed follow-up appointments, patient agrees Scheduled appt.

## 2023-01-19 NOTE — Telephone Encounter (Signed)
-----   Message from Abbie Sons, MD sent at 01/18/2023  8:59 PM EDT ----- T level looks good at ~ 500. Refill was sent to pharmacy. Sched office visit with T level and hct July 2024

## 2023-03-26 DIAGNOSIS — G4733 Obstructive sleep apnea (adult) (pediatric): Secondary | ICD-10-CM | POA: Diagnosis not present

## 2023-04-21 ENCOUNTER — Other Ambulatory Visit: Payer: Self-pay

## 2023-04-21 NOTE — Progress Notes (Signed)
Random UDS and ETOH completed and cleared for [facility]-PD.

## 2023-04-26 DIAGNOSIS — G4733 Obstructive sleep apnea (adult) (pediatric): Secondary | ICD-10-CM | POA: Diagnosis not present

## 2023-05-04 ENCOUNTER — Other Ambulatory Visit: Payer: Self-pay | Admitting: *Deleted

## 2023-05-04 DIAGNOSIS — E291 Testicular hypofunction: Secondary | ICD-10-CM

## 2023-05-12 ENCOUNTER — Other Ambulatory Visit: Payer: 59

## 2023-05-12 DIAGNOSIS — E291 Testicular hypofunction: Secondary | ICD-10-CM | POA: Diagnosis not present

## 2023-05-13 ENCOUNTER — Other Ambulatory Visit: Payer: 59

## 2023-05-13 LAB — TESTOSTERONE: Testosterone: 206 ng/dL — ABNORMAL LOW (ref 264–916)

## 2023-05-13 LAB — HEMATOCRIT: Hematocrit: 44.8 % (ref 37.5–51.0)

## 2023-05-22 ENCOUNTER — Ambulatory Visit (INDEPENDENT_AMBULATORY_CARE_PROVIDER_SITE_OTHER): Payer: 59 | Admitting: Urology

## 2023-05-22 ENCOUNTER — Encounter: Payer: Self-pay | Admitting: Urology

## 2023-05-22 VITALS — BP 128/79 | HR 64 | Ht 71.0 in | Wt 225.0 lb

## 2023-05-22 DIAGNOSIS — E291 Testicular hypofunction: Secondary | ICD-10-CM | POA: Diagnosis not present

## 2023-05-22 MED ORDER — XYOSTED 75 MG/0.5ML ~~LOC~~ SOAJ: 75.0000 mg | SUBCUTANEOUS | 3 refills | Status: DC

## 2023-05-22 NOTE — Progress Notes (Signed)
I, Maysun L Gibbs,acting as a scribe for Riki Altes, MD.,have documented all relevant documentation on the behalf of Riki Altes, MD,as directed by  Riki Altes, MD while in the presence of Riki Altes, MD.  05/22/2023 9:48 AM   Arlys John Etheleen Nicks Sep 30, 1986 [id number]  Referring provider: Joni Reining, PA-C [address] [place],  [place] [address]  Chief Complaint  Patient presents with   Follow-up   Urologic history: 1. Hypogonadism Decreased libido and increased fatigue Testosterone level low at 227 ng/dL in 74/2595  HPI: Adrian Lang is a 37 y.o. male presents for follow-up of hypogonadism.   Initially seen January 2024 with low testosterone and symptoms of decreased libido and significant fatigue. He elected to start a topical gel, which he started in January 2024, and a 2 month testosterone level was in the 500 range. Recent labs 05/12/23 remarkable for a testosterone level of 206 and hematocrit 44.8 He has not seen any significant improvement in his symptoms and even when his level was 496 was still experiencing fatigue and decreased libido.  He is interested in switching over to injections.    PMH: Past Medical History:  Diagnosis Date   Arthritis    left ankle   Elevated LDL cholesterol level    GERD (gastroesophageal reflux disease)    can cause wheezing if gets bad   Low back pain    Obesity    Rectal bleeding    Sleep apnea    uses cpap.  has mild case of sleep apnea   Wheezing    at night mostly    Surgical History: Past Surgical History:  Procedure Laterality Date   ANKLE ARTHROSCOPY Left 10/06/2018   Procedure: ARTHROSCOPIC DEBRIDEMENT OF PAINFUL SCAR TISSUE FROM LEFT ANKLE RETAINED ORTHOPEDIC HARDWARE;  Surgeon: Christena Flake, MD;  Location: [facility];  Service: Orthopedics;  Laterality: Left;  SMALL JOINT ARTHROSCOPY SYSTEM WITH THE 2.9 MM FULL RADIUS RESECTOR   ANKLE ARTHROSCOPY Left 04/27/2019    Procedure: ANKLE ARTHROSCOPY LEFT, WITH DEBRIDEMENT AND EXCISION OF OSTEOPHYTES;  Surgeon: Christena Flake, MD;  Location: [facility];  Service: Orthopedics;  Laterality: Left;   HARDWARE REMOVAL Left 04/27/2019   Procedure: HARDWARE REMOVAL;  Surgeon: Christena Flake, MD;  Location: [facility];  Service: Orthopedics;  Laterality: Left;   KNEE SURGERY Right 2012   knee scope   ORIF ANKLE FRACTURE Left 07/29/2017   Procedure: OPEN REDUCTION INTERNAL FIXATION (ORIF) ANKLE FRACTURE;  Surgeon: Christena Flake, MD;  Location: [facility];  Service: Orthopedics;  Laterality: Left;   TONSILECTOMY, ADENOIDECTOMY, BILATERAL MYRINGOTOMY AND TUBES     variceal vein repair Left    When a teenager    Home Medications:  Allergies as of 05/22/2023   No Known Allergies      Medication List        Accurate as of May 22, 2023  9:48 AM. If you have any questions, ask your nurse or doctor.          STOP taking these medications    Testosterone 20.25 MG/ACT (1.62%) Gel Stopped by: Riki Altes       TAKE these medications    acetaminophen 500 MG tablet Commonly known as: TYLENOL Take 500 mg by mouth every 6 (six) hours as needed.   albuterol 108 (90 Base) MCG/ACT inhaler Commonly known as: VENTOLIN HFA Inhale 2 puffs into the lungs every 6 (six) hours as needed for wheezing  or shortness of breath.   fluticasone 50 MCG/ACT nasal spray Commonly known as: FLONASE   orphenadrine 100 MG tablet Commonly known as: NORFLEX Take 1 tablet (100 mg total) by mouth 2 (two) times daily.   triamcinolone cream 0.1 % Commonly known as: KENALOG Apply topically daily.   Xyosted 75 MG/0.5ML Soaj Generic drug: Testosterone Enanthate Inject 75 mg into the skin once a week. Started by: Riki Altes        Allergies: No Known Allergies  Family History: Family History  Problem Relation Age of Onset   Breast cancer Mother     Social History:  reports that he has never smoked. He has never used  smokeless tobacco. He reports current alcohol use. He reports that he does not use drugs.   Physical Exam: BP 128/79   Pulse 64   Ht 5\' 11"  (1.803 m)   Wt 225 lb (102.1 kg)   BMI 31.38 kg/m   Constitutional:  Alert and oriented, No acute distress. HEENT: [place] AT, moist mucus membranes.  Trachea midline, no masses. Cardiovascular: No clubbing, cyanosis, or edema. Respiratory: Normal respiratory effort, no increased work of breathing. GI: Abdomen is soft, nontender, nondistended, no abdominal masses Skin: No rashes, bruises or suspicious lesions. Neurologic: Grossly intact, no focal deficits, moving all 4 extremities. Psychiatric: Normal mood and affect.   Assessment & Plan:    1. Hypogonadism Recent testosterone level with regular gel application was low at 206 We'll switch to injections and he was interested in [place], we'll see if covered by insurance. Lab visit 6 months with testosterone, PSA, H&H Office visit 1 year with testosterone, H&H When he switches over to injections, we'll get a 2 month testosterone level.  [facility] [address], [address] [place], [place] [address] [phone]

## 2023-05-26 DIAGNOSIS — G4733 Obstructive sleep apnea (adult) (pediatric): Secondary | ICD-10-CM | POA: Diagnosis not present

## 2023-07-09 ENCOUNTER — Encounter: Payer: Self-pay | Admitting: Physician Assistant

## 2023-07-09 ENCOUNTER — Other Ambulatory Visit: Payer: Self-pay | Admitting: Physician Assistant

## 2023-07-09 ENCOUNTER — Ambulatory Visit: Payer: Self-pay | Admitting: Physician Assistant

## 2023-07-09 VITALS — BP 127/85 | HR 81 | Ht 71.0 in | Wt 225.0 lb

## 2023-07-09 DIAGNOSIS — G8929 Other chronic pain: Secondary | ICD-10-CM

## 2023-07-09 MED ORDER — ORPHENADRINE CITRATE ER 100 MG PO TB12: 100.0000 mg | ORAL_TABLET | Freq: Two times a day (BID) | ORAL | 1 refills | Status: AC

## 2023-07-09 MED ORDER — NAPROXEN 500 MG PO TABS: 500.0000 mg | ORAL_TABLET | Freq: Two times a day (BID) | ORAL | Status: AC

## 2023-07-09 NOTE — Progress Notes (Signed)
Pt presents today with aggravated lower back pain (right side) pt working out and on bike and says it aggravated  pre existing injury. Gave pt bio-freeze patch until he can get his prescription.

## 2023-07-09 NOTE — Progress Notes (Signed)
   Subjective: Low back pain    Patient ID: Adrian Lang, male    DOB: 19-Jun-1986, 37 y.o.   MRN: [id number]  HPI Patient presents for low back pain secondary to lifting incident at the gym today.  Patient denies radicular component to pain.  Denies bladder or bowel dysfunction.  Patient has a history of chronic back pain and is followed by chiropractor.   Review of Systems Negative except for above complaint    Objective:   Physical Exam    BP 127/85  Pulse 81  SpO2 97 %  Weight 225 lb (102.1 kg)  Height 5\' 11"  (1.803 m)   BMI 31.38 kg/m2  BSA 2.26 m2  No acute distress.  Patient stated throughout the exam. Examination of the back shows no obvious deformity.  There is no guarding palpation of spinal processes.  Patient has decreased range of motion is all fields.  Patient has bilateral muscle spasm with lateral and flexion movements.    Assessment & Plan: Mechanical low back pain  Patient given a prescription for Norflex and naproxen.  Patient given discharge care instruction and a heating pad.  Patient advised to follow-up with no improvement or worsening complaint.

## 2023-07-09 NOTE — Progress Notes (Unsigned)
   Subjective: Low back pain    Patient ID: Adrian Lang, male    DOB: 06/23/1986, 37 y.o.   MRN: [id number]  HPI Patient presents for low back pain secondary to lifting incident at the gym today.  Denies bladder or bowel dysfunction.  Denies radicular component to pain.  Patient has history of chronic back pain.  Patient is followed by chiropractor.  Last images were unremarkable.   Review of Systems Negative except for above complaint.    Objective:   Physical Exam        Assessment & Plan:

## 2023-07-20 ENCOUNTER — Other Ambulatory Visit: Payer: Self-pay | Admitting: Urology

## 2023-08-12 ENCOUNTER — Encounter: Payer: Self-pay | Admitting: Nurse Practitioner

## 2023-08-12 ENCOUNTER — Ambulatory Visit (INDEPENDENT_AMBULATORY_CARE_PROVIDER_SITE_OTHER): Payer: No Typology Code available for payment source | Admitting: Nurse Practitioner

## 2023-08-12 VITALS — BP 122/70 | HR 77 | Temp 98.2°F | Ht 71.0 in | Wt 253.2 lb

## 2023-08-12 DIAGNOSIS — F419 Anxiety disorder, unspecified: Secondary | ICD-10-CM | POA: Diagnosis not present

## 2023-08-12 DIAGNOSIS — F4310 Post-traumatic stress disorder, unspecified: Secondary | ICD-10-CM

## 2023-08-12 DIAGNOSIS — M545 Low back pain, unspecified: Secondary | ICD-10-CM | POA: Diagnosis not present

## 2023-08-12 DIAGNOSIS — G8929 Other chronic pain: Secondary | ICD-10-CM | POA: Insufficient documentation

## 2023-08-12 DIAGNOSIS — Z1329 Encounter for screening for other suspected endocrine disorder: Secondary | ICD-10-CM

## 2023-08-12 DIAGNOSIS — Z114 Encounter for screening for human immunodeficiency virus [HIV]: Secondary | ICD-10-CM | POA: Diagnosis not present

## 2023-08-12 DIAGNOSIS — Z23 Encounter for immunization: Secondary | ICD-10-CM | POA: Diagnosis not present

## 2023-08-12 DIAGNOSIS — Z13220 Encounter for screening for lipoid disorders: Secondary | ICD-10-CM | POA: Diagnosis not present

## 2023-08-12 DIAGNOSIS — Z1159 Encounter for screening for other viral diseases: Secondary | ICD-10-CM | POA: Diagnosis not present

## 2023-08-12 DIAGNOSIS — G4733 Obstructive sleep apnea (adult) (pediatric): Secondary | ICD-10-CM

## 2023-08-12 DIAGNOSIS — F32A Depression, unspecified: Secondary | ICD-10-CM

## 2023-08-12 DIAGNOSIS — E291 Testicular hypofunction: Secondary | ICD-10-CM | POA: Diagnosis not present

## 2023-08-12 DIAGNOSIS — Z131 Encounter for screening for diabetes mellitus: Secondary | ICD-10-CM | POA: Diagnosis not present

## 2023-08-12 DIAGNOSIS — Z0001 Encounter for general adult medical examination with abnormal findings: Secondary | ICD-10-CM | POA: Diagnosis not present

## 2023-08-12 MED ORDER — SERTRALINE HCL 50 MG PO TABS
50.0000 mg | ORAL_TABLET | Freq: Every day | ORAL | 0 refills | Status: DC
Start: 2023-08-12 — End: 2023-09-28

## 2023-08-12 NOTE — Progress Notes (Signed)
Bethanie Dicker, NP-C Phone: [phone]  Adrian Lang is a 38 y.o. male who presents today to establish care and for annual exam. He currently followed by Urology, he is requesting a referral be sent to their office for his new insurance through the [place].   Diet: Well balanced- eats out for lunch- tries to avoid fast food, high protein Exercise: Cardio on M,W,F- run 2 miles, bike 25 minutes. Weights and strength training 5 days per week.  Family history-  Prostate cancer: No  Colon cancer: No Sexually active: Yes Vaccines-   Flu: 08/07/2023  Tetanus: 10/03/2009- Due!  COVID19: x 3 HIV screening: Today Hep C Screening: Today Tobacco use: No Alcohol use: No Illicit Drug use: No Dentist: Yes Ophthalmology: No  OSA- reports being diagnosed with mild OSA in the past. Last sleep study in 2019.  CPAP use: Not using regularly Hypersomnia: Yes Well rested: No CPAP company: Unsure  PHQ- 13 and GAD- 10 today. He reports being easily agitated and having little motivation or interest in doing things. 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 He has difficulty sleeping and nightmares/night terrors. He is opening to seeing a counselor and starting on medication to help with his symptoms. He feels that it has been a long time coming and is ready for help. Denies SI/HI.   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   Status post ORIF of fracture of ankle 10/07/2018   Other personal history presenting hazards to health 11/03/2002   Pain in joint, lower leg 12/24/2020   Reason for consultation 12/24/2020   Tear of lateral cartilage or meniscus of knee, current 03/14/2012   Hypogonadism in male 08/12/2023   Chronic bilateral low back pain without sciatica 08/12/2023   Encounter for routine adult medical exam with abnormal findings  08/12/2023   PTSD (post-traumatic stress disorder) 08/12/2023   Anxiety and depression 08/12/2023   OSA (obstructive sleep apnea) 08/25/2023   Resolved Ambulatory Problems    Diagnosis Date Noted   No Resolved Ambulatory Problems   Past Medical History:  Diagnosis Date   Arthritis    Depression    Elevated LDL cholesterol level    GERD (gastroesophageal reflux disease)    Low back pain    Obesity    Rectal bleeding    Sleep apnea    Wheezing     Family History  Problem Relation Age of Onset   Early death Mother    Cancer Mother    Breast cancer Mother    Diabetes Father    Hypertension Father    Hyperlipidemia Father    Cancer Brother    Cancer Maternal Grandmother     Social History   Socioeconomic History   Marital status: Married    Spouse name: nicole   Number of children: 2   Years of education: Not on file   Highest education level: Not on file  Occupational History   Not on file  Tobacco Use   Smoking status: Never   Smokeless tobacco: Never  Vaping Use   Vaping status: Never Used  Substance and Sexual Activity   Alcohol use: Yes    Comment: occasionally   Drug use: Never   Sexual activity: Yes  Other Topics Concern   Not on file  Social History Narrative   Not  on file   Social Determinants of Health   Financial Resource Strain: Not on file  Food Insecurity: Not on file  Transportation Needs: Not on file  Physical Activity: Not on file  Stress: Not on file  Social Connections: Not on file  Intimate Partner Violence: Not on file    ROS  General:  Negative for unexplained weight loss, fever Skin: Negative for new or changing mole, sore that won't heal HEENT: Negative for trouble hearing, trouble seeing, ringing in ears, mouth sores, hoarseness, change in voice, dysphagia. CV:  Negative for chest pain, dyspnea, edema, palpitations Resp: Negative for cough, dyspnea, hemoptysis GI: Negative for nausea, vomiting, diarrhea, constipation,  abdominal pain, melena, hematochezia. GU: Negative for dysuria, incontinence, urinary hesitance, hematuria, vaginal or penile discharge, polyuria, sexual difficulty, lumps in testicle or breasts MSK: Negative for muscle cramps or aches, joint pain or swelling Neuro: Negative for headaches, weakness, numbness, dizziness, passing out/fainting Psych: Negative for memory problems  Objective  Physical Exam Vitals:   08/12/23 1353  BP: 122/70  Pulse: 77  Temp: 98.2 F (36.8 C)  SpO2: 96%    BP Readings from Last 3 Encounters:  08/12/23 122/70  07/09/23 127/85  05/22/23 128/79   Wt Readings from Last 3 Encounters:  08/12/23 253 lb 3.2 oz (114.9 kg)  07/09/23 225 lb (102.1 kg)  05/22/23 225 lb (102.1 kg)    Physical Exam Constitutional:      General: He is not in acute distress.    Appearance: Normal appearance.  HENT:     Head: Normocephalic.     Right Ear: Tympanic membrane normal.     Left Ear: Tympanic membrane normal.     Nose: Nose normal.     Mouth/Throat:     Mouth: Mucous membranes are moist.     Pharynx: Oropharynx is clear.  Eyes:     Conjunctiva/sclera: Conjunctivae normal.     Pupils: Pupils are equal, round, and reactive to light.  Neck:     Thyroid: No thyromegaly.  Cardiovascular:     Rate and Rhythm: Normal rate and regular rhythm.     Heart sounds: Normal heart sounds.  Pulmonary:     Effort: Pulmonary effort is normal.     Breath sounds: Normal breath sounds.  Abdominal:     General: Abdomen is flat. Bowel sounds are normal.     Palpations: Abdomen is soft. There is no mass.     Tenderness: There is no abdominal tenderness.  Musculoskeletal:        General: Normal range of motion.  Lymphadenopathy:     Cervical: No cervical adenopathy.  Skin:    General: Skin is warm and dry.     Findings: No rash.  Neurological:     General: No focal deficit present.     Mental Status: He is alert.  Psychiatric:        Mood and Affect: Mood normal.         Behavior: Behavior normal.    Assessment/Plan:   Encounter for routine adult medical exam with abnormal findings Assessment & Plan: Physical exam complete. Lab work as outlined. Will contact patient with results. Flu vaccine- UTD. Tetanus vaccine- given today in office. Declined additional COVID vaccines. HIV and Hep C screenings today. Recommended follow up with Dentist and establishing with Ophthalmology for annual exams. Encouraged to continue healthy diet and exercise. Return to care in 4 weeks, sooner PRN.   Orders: -     CBC with Differential/Platelet -  Comprehensive metabolic panel  Anxiety and depression Assessment & Plan: PHQ- 13 and GAD- 10. Denies SI/HI. Will start patient on Zoloft 50 mg daily. Counseled patient on common side effects. Encouraged to contact if worsening symptoms, unusual behavior changes or suicidal thoughts occur. Referral placed to [facility] for patient to establish with counselor. He will follow up in 4 weeks, sooner PRN. Will continue to monitor.   Orders: -     Ambulatory referral to Psychology -     Sertraline HCl; Take 1 tablet (50 mg total) by mouth daily.  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 Will continue to monitor.   Orders: -     Ambulatory referral to Psychology -     Sertraline HCl; Take 1 tablet (50 mg total) by mouth daily.  Dispense: 90 tablet; Refill: 0  Hypogonadism in male Assessment & Plan: Chronic issue. Managed by Urology. Referral placed for new insurance. Follow up as scheduled.   Orders: -     Ambulatory referral to Urology  Chronic bilateral low back pain without sciatica Assessment & Plan: Chronic issue. Stable with muscle relaxer and Naproxen PRN. Has 1-2 episodes of severe pain that will last several days. Seeing a chiropractor regularly. Well managed with current  regimen. He will contact if his symptoms are worsening or changing.    OSA (obstructive sleep apnea) Assessment & Plan: Chronic issue. Not wearing CPAP regularly. Increased daytime sleepiness. Will refer to Pulmonology for a new sleep study.   Orders: -     Ambulatory referral to Pulmonology  Lipid screening -     Lipid panel  Thyroid disorder screen -     TSH  Diabetes mellitus screening -     Hemoglobin A1c  Need for Tdap vaccination -     Tdap vaccine greater than or equal to 7yo IM  Encounter for hepatitis C screening test for low risk patient -     Hepatitis C antibody  Encounter for screening for HIV -     HIV Antibody (routine testing w rflx)   Return in about 4 weeks (around 09/09/2023) for Anxiety/Depression.   Bethanie Dicker, NP-C [facility] [employer]

## 2023-08-13 LAB — CBC WITH DIFFERENTIAL/PLATELET
Basophils Absolute: 0.1 10*3/uL (ref 0.0–0.1)
Basophils Relative: 0.8 % (ref 0.0–3.0)
Eosinophils Absolute: 0.3 10*3/uL (ref 0.0–0.7)
Eosinophils Relative: 4.1 % (ref 0.0–5.0)
HCT: 44.6 % (ref 39.0–52.0)
Hemoglobin: 14.8 g/dL (ref 13.0–17.0)
Lymphocytes Relative: 15 % (ref 12.0–46.0)
Lymphs Abs: 1.1 10*3/uL (ref 0.7–4.0)
MCHC: 33.1 g/dL (ref 30.0–36.0)
MCV: 85.1 fL (ref 78.0–100.0)
Monocytes Absolute: 0.7 10*3/uL (ref 0.1–1.0)
Monocytes Relative: 8.7 % (ref 3.0–12.0)
Neutro Abs: 5.4 10*3/uL (ref 1.4–7.7)
Neutrophils Relative %: 71.4 % (ref 43.0–77.0)
Platelets: 202 10*3/uL (ref 150.0–400.0)
RBC: 5.23 Mil/uL (ref 4.22–5.81)
RDW: 13.3 % (ref 11.5–15.5)
WBC: 7.6 10*3/uL (ref 4.0–10.5)

## 2023-08-13 LAB — LIPID PANEL
Cholesterol: 182 mg/dL (ref 0–200)
HDL: 40.1 mg/dL (ref >39.00)
LDL Cholesterol: 122 mg/dL — ABNORMAL HIGH (ref 0–99)
NonHDL: 141.58
Total CHOL/HDL Ratio: 5
Triglycerides: 99 mg/dL (ref 0.0–149.0)
VLDL: 19.8 mg/dL (ref 0.0–40.0)

## 2023-08-13 LAB — COMPREHENSIVE METABOLIC PANEL
ALT: 27 U/L (ref 0–53)
AST: 19 U/L (ref 0–37)
Albumin: 4.6 g/dL (ref 3.5–5.2)
Alkaline Phosphatase: 47 U/L (ref 39–117)
BUN: 23 mg/dL (ref 6–23)
CO2: 31 meq/L (ref 19–32)
Calcium: 9.6 mg/dL (ref 8.4–10.5)
Chloride: 101 meq/L (ref 96–112)
Creatinine, Ser: 1.12 mg/dL (ref 0.40–1.50)
GFR: 83.15 mL/min (ref >60.00)
Glucose, Bld: 94 mg/dL (ref 70–99)
Potassium: 4.1 meq/L (ref 3.5–5.1)
Sodium: 139 meq/L (ref 135–145)
Total Bilirubin: 1 mg/dL (ref 0.2–1.2)
Total Protein: 6.5 g/dL (ref 6.0–8.3)

## 2023-08-13 LAB — HEPATITIS C ANTIBODY: Hepatitis C Ab: NONREACTIVE

## 2023-08-13 LAB — HIV ANTIBODY (ROUTINE TESTING W REFLEX): HIV 1&2 Ab, 4th Generation: NONREACTIVE

## 2023-08-13 LAB — HEMOGLOBIN A1C: Hgb A1c MFr Bld: 5.5 % (ref 4.6–6.5)

## 2023-08-13 LAB — TSH: TSH: 0.58 u[IU]/mL (ref 0.35–5.50)

## 2023-08-14 ENCOUNTER — Encounter: Payer: Self-pay | Admitting: Internal Medicine

## 2023-08-21 ENCOUNTER — Other Ambulatory Visit: Payer: Self-pay

## 2023-08-25 ENCOUNTER — Encounter: Payer: Self-pay | Admitting: Nurse Practitioner

## 2023-08-25 DIAGNOSIS — G4733 Obstructive sleep apnea (adult) (pediatric): Secondary | ICD-10-CM | POA: Insufficient documentation

## 2023-08-25 NOTE — Assessment & Plan Note (Signed)
Chronic issue. Managed by Urology. Referral placed for new insurance. Follow up as scheduled.

## 2023-08-25 NOTE — Assessment & Plan Note (Addendum)
Physical exam complete. Lab work as outlined. Will contact patient with results. Flu vaccine- UTD. Tetanus vaccine- given today in office. Declined additional COVID vaccines. HIV and Hep C screenings today. Recommended follow up with Dentist and establishing with Ophthalmology for annual exams. Encouraged to continue healthy diet and exercise. Return to care in 4 weeks, sooner PRN.

## 2023-08-25 NOTE — Assessment & Plan Note (Signed)
Chronic issue. Stable with muscle relaxer and Naproxen PRN. Has 1-2 episodes of severe pain that will last several days. Seeing a chiropractor regularly. Well managed with current regimen. He will contact if his symptoms are worsening or changing.

## 2023-08-25 NOTE — Assessment & Plan Note (Addendum)
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 Will continue to monitor.

## 2023-08-25 NOTE — Assessment & Plan Note (Signed)
PHQ- 13 and GAD- 10. Denies SI/HI. Will start patient on Zoloft 50 mg daily. Counseled patient on common side effects. Encouraged to contact if worsening symptoms, unusual behavior changes or suicidal thoughts occur. Referral placed to [facility] for patient to establish with counselor. He will follow up in 4 weeks, sooner PRN. Will continue to monitor.

## 2023-08-25 NOTE — Assessment & Plan Note (Signed)
Chronic issue. Not wearing CPAP regularly. Increased daytime sleepiness. Will refer to Pulmonology for a new sleep study.

## 2023-08-26 ENCOUNTER — Telehealth: Payer: Self-pay | Admitting: Nurse Practitioner

## 2023-08-26 MED ORDER — XYOSTED 75 MG/0.5ML ~~LOC~~ SOAJ: 75.0000 mg | SUBCUTANEOUS | 3 refills | Status: DC

## 2023-08-26 NOTE — Telephone Encounter (Signed)
Melissa from [facility] called stating the pt want the referral sent through [place] so he does not have to pay for it

## 2023-08-27 ENCOUNTER — Telehealth: Payer: Self-pay

## 2023-08-27 NOTE — Telephone Encounter (Signed)
Fax was received from the [place] stating Pulmonology was disapproved.   Form has been placed in provider review folder.

## 2023-09-09 ENCOUNTER — Ambulatory Visit: Payer: 59 | Admitting: Nurse Practitioner

## 2023-09-22 ENCOUNTER — Encounter: Payer: 59 | Admitting: Physician Assistant

## 2023-09-28 ENCOUNTER — Encounter: Payer: Self-pay | Admitting: Nurse Practitioner

## 2023-09-28 ENCOUNTER — Ambulatory Visit (INDEPENDENT_AMBULATORY_CARE_PROVIDER_SITE_OTHER): Payer: 59 | Admitting: Nurse Practitioner

## 2023-09-28 VITALS — BP 120/78 | HR 66 | Temp 98.8°F | Ht 71.0 in | Wt 253.2 lb

## 2023-09-28 DIAGNOSIS — F32A Depression, unspecified: Secondary | ICD-10-CM

## 2023-09-28 DIAGNOSIS — F419 Anxiety disorder, unspecified: Secondary | ICD-10-CM

## 2023-09-28 DIAGNOSIS — E291 Testicular hypofunction: Secondary | ICD-10-CM

## 2023-09-28 DIAGNOSIS — G4733 Obstructive sleep apnea (adult) (pediatric): Secondary | ICD-10-CM | POA: Diagnosis not present

## 2023-09-28 MED ORDER — SERTRALINE HCL 100 MG PO TABS
100.0000 mg | ORAL_TABLET | Freq: Every day | ORAL | 1 refills | Status: DC
Start: 2023-09-28 — End: 2023-12-31

## 2023-09-28 NOTE — Progress Notes (Signed)
Bethanie Dicker, NP-C Phone: [phone]  Adrian Lang is a 37 y.o. male who presents today for follow up.   Discussed the use of AI scribe software for clinical note transcription with the patient, who gave verbal consent to proceed.  History of Present Illness   The patient, with a history of depression, reports no noticeable improvement in mood or overall well-being after four weeks of treatment with Zoloft 50mg  daily. He expresses uncertainty about how he should be feeling, indicating that he has not experienced any significant changes since starting the medication. The patient has been taking the medication consistently, without any notable side effects such as excessive sleepiness.  The [facility] also [facility], including a sleep study. He has been using a CPAP machine for an unspecified period, but is unsure if the settings are correct.  The patient recently recovered from a respiratory illness, characterized by cough and congestion, but reports that these symptoms have largely resolved. He denies any other new or worsening symptoms.      Social History   Tobacco Use  Smoking Status Never  Smokeless Tobacco Never    Current Outpatient Medications on File Prior to Visit  Medication Sig Dispense Refill   acetaminophen (TYLENOL) 500 MG tablet Take 500 mg by mouth every 6 (six) hours as needed.     albuterol (PROVENTIL HFA;VENTOLIN HFA) 108 (90 Base) MCG/ACT inhaler Inhale 2 puffs into the lungs every 6 (six) hours as needed for wheezing or shortness of breath. 1 Inhaler 2   fluticasone (FLONASE) 50 MCG/ACT nasal spray      naproxen (NAPROSYN) 500 MG tablet Take 1 tablet (500 mg total) by mouth 2 (two) times daily with a meal. 20 tablet 00   orphenadrine (NORFLEX) 100 MG tablet Take 1 tablet (100 mg total) by mouth 2 (two) times daily. 20 tablet 1   Testosterone Enanthate (XYOSTED) 75 MG/0.5ML SOAJ Inject  75 mg into the skin once a week. 2 mL 3   triamcinolone cream (KENALOG) 0.1 % Apply topically daily.     No current facility-administered medications on file prior to visit.     ROS see history of present illness  Objective  Physical Exam Vitals:   09/28/23 0819  BP: 120/78  Pulse: 66  Temp: 98.8 F (37.1 C)  SpO2: 95%    BP Readings from Last 3 Encounters:  09/28/23 120/78  08/12/23 122/70  07/09/23 127/85   Wt Readings from Last 3 Encounters:  09/28/23 253 lb 3.2 oz (114.9 kg)  08/12/23 253 lb 3.2 oz (114.9 kg)  07/09/23 225 lb (102.1 kg)    Physical Exam Constitutional:      General: He is not in acute distress.    Appearance: Normal appearance.  HENT:     Head: Normocephalic.  Cardiovascular:     Rate and Rhythm: Normal rate and regular rhythm.     Heart sounds: Normal heart sounds.  Pulmonary:     Effort: Pulmonary effort is normal.     Breath sounds: Normal breath sounds.  Skin:    General: Skin is warm and dry.  Neurological:     General: No focal deficit present.     Mental Status: He is alert.  Psychiatric:        Mood and Affect: Mood normal.        Behavior: Behavior normal.    Assessment/Plan: Please see individual problem list.  Anxiety and depression Assessment & Plan:  Despite 4 weeks on Zoloft 50mg  daily, his mood and motivation have not improved, with no adverse effects reported. We will increase Zoloft to 100mg  daily and check in 5 weeks to assess his response to the increased dose. PHQ- 11 and GAD- 10 today. Denies SI/HI. Encouraged to contact if worsening symptoms, unusual behavior changes or suicidal thoughts occur.   Orders: -     Sertraline HCl; Take 1 tablet (100 mg total) by mouth daily.  Dispense: 90 tablet; Refill: 1  OSA (obstructive sleep apnea) Assessment & Plan: His referral for a sleep study with Pulmonology is pending approval through the [place]. We will continue to monitor the referral and contact the patient when it is  approved.    Hypogonadism in male Assessment & Plan: He is established with urology, but a follow-up is needed for monitoring. His referral to urology is pending approval through the [place]. We will contact [facility] regarding [facility] and the [facility] process.     Return in about 5 weeks (around 11/02/2023) for Anxiety/Depression.   Bethanie Dicker, NP-C [facility] [employer]

## 2023-09-28 NOTE — Assessment & Plan Note (Signed)
His referral for a sleep study with Pulmonology is pending approval through the [place]. We will continue to monitor the referral and contact the patient when it is approved.

## 2023-09-28 NOTE — Assessment & Plan Note (Signed)
Despite 4 weeks on Zoloft 50mg  daily, his mood and motivation have not improved, with no adverse effects reported. We will increase Zoloft to 100mg  daily and check in 5 weeks to assess his response to the increased dose. PHQ- 11 and GAD- 10 today. Denies SI/HI. Encouraged to contact if worsening symptoms, unusual behavior changes or suicidal thoughts occur.

## 2023-09-28 NOTE — Assessment & Plan Note (Addendum)
He is established with urology, but a follow-up is needed for monitoring. His referral to urology is pending approval through the [place]. We will contact the [facility] regarding [facility] ref[facility] and the approval process.

## 2023-09-29 NOTE — Telephone Encounter (Signed)
Waited for 34 minutes to finally got connected with someone and then the phone disconnected I am not sure if person hung up or not I will try to see if anyone has a fax number for them and you can create a letter that I can fax or I can create one to fax to them in regards to this

## 2023-10-12 ENCOUNTER — Ambulatory Visit: Payer: Self-pay | Admitting: Physician Assistant

## 2023-10-12 ENCOUNTER — Encounter: Payer: Self-pay | Admitting: Physician Assistant

## 2023-10-12 VITALS — BP 129/88 | HR 68 | Temp 98.0°F | Resp 12

## 2023-10-12 DIAGNOSIS — H6992 Unspecified Eustachian tube disorder, left ear: Secondary | ICD-10-CM

## 2023-10-12 MED ORDER — FEXOFENADINE-PSEUDOEPHED ER 60-120 MG PO TB12: 1.0000 | ORAL_TABLET | Freq: Two times a day (BID) | ORAL | 0 refills | Status: DC

## 2023-10-12 MED ORDER — METHYLPREDNISOLONE 4 MG PO TBPK: ORAL_TABLET | ORAL | 0 refills | Status: DC

## 2023-10-12 NOTE — Progress Notes (Signed)
   Subjective: Left hearing loss    Patient ID: Adrian Lang, male    DOB: 1986-02-11, 37 y.o.   MRN: [id number]  HPI Patient complains of acute onset of hearing loss to left ear.  States multiple sounds and a fluid sensation in the left ear.  Patient has a history of bilateral tinnitus but denies any complaint with right ear at this time.  Denies URI signs and symptoms.  Adrian Lang this is that Adrian Lang like for adult continue with this therapy whenever no one ever the recommendation was because he was went I went a second I went up gotten ready for you noted like that last time is just not right yeah is kind of what I diagnosed could not tell me that it really only able some mild effusion denies vertigo   Review of Systems Obstructive sleep apnea    Objective:   Physical Exam BP 129/88  BP Location Left Arm  Patient Position Sitting  Cuff Size Large  Pulse 68  Resp 12  Temp 98 F (36.7 C)  Temp src Temporal  SpO2 97 %  No acute distress. HEENT is unremarkable.  Specifically no edema or erythema bilateral TMs. Neck is supple lymphadenopathy or bruits. Lungs are clear to auscultation. Heart regular rate and rhythm.       Assessment & Plan: Left eustachian tube dysfunction  Advised patient in 3-day trial of steroids with a decongestant/antihistamine medication.  If no improvement will consider audiogram and a consult to ENT.

## 2023-10-12 NOTE — Progress Notes (Signed)
Left ear muffled ringing noise - "this is like really loud" Feels like I got fluid in my ear. Denies pain States loud noises from radio are piercing. Came on suddenly around 8:00 am - sat down in office chair & it just went out  Denies Cold S/sx  Nose bleed a few days ago - thinks related to dryness  Everything is contained to left eart - no radiation down the side fo the face  States he already experiences ringing in left ear  AMD

## 2023-10-15 ENCOUNTER — Ambulatory Visit: Payer: Self-pay | Admitting: Physician Assistant

## 2023-11-06 ENCOUNTER — Ambulatory Visit (INDEPENDENT_AMBULATORY_CARE_PROVIDER_SITE_OTHER): Payer: 59 | Admitting: Nurse Practitioner

## 2023-11-06 ENCOUNTER — Encounter: Payer: Self-pay | Admitting: Nurse Practitioner

## 2023-11-06 VITALS — BP 130/82 | HR 72 | Temp 98.3°F | Ht 71.0 in | Wt 269.0 lb

## 2023-11-06 DIAGNOSIS — F32A Depression, unspecified: Secondary | ICD-10-CM | POA: Diagnosis not present

## 2023-11-06 DIAGNOSIS — G4733 Obstructive sleep apnea (adult) (pediatric): Secondary | ICD-10-CM

## 2023-11-06 DIAGNOSIS — H6993 Unspecified Eustachian tube disorder, bilateral: Secondary | ICD-10-CM

## 2023-11-06 DIAGNOSIS — E291 Testicular hypofunction: Secondary | ICD-10-CM | POA: Diagnosis not present

## 2023-11-06 DIAGNOSIS — F419 Anxiety disorder, unspecified: Secondary | ICD-10-CM | POA: Diagnosis not present

## 2023-11-06 DIAGNOSIS — F4310 Post-traumatic stress disorder, unspecified: Secondary | ICD-10-CM

## 2023-11-06 MED ORDER — FLUTICASONE PROPIONATE 50 MCG/ACT NA SUSP: 2.0000 | Freq: Every day | NASAL | 2 refills | Status: DC

## 2023-11-06 MED ORDER — FEXOFENADINE-PSEUDOEPHED ER 60-120 MG PO TB12: 1.0000 | ORAL_TABLET | Freq: Two times a day (BID) | ORAL | 0 refills | Status: DC

## 2023-11-06 MED ORDER — SERTRALINE HCL 50 MG PO TABS
50.0000 mg | ORAL_TABLET | Freq: Every day | ORAL | 1 refills | Status: DC
Start: 2023-11-06 — End: 2024-04-27

## 2023-11-06 NOTE — Progress Notes (Signed)
 Leron Glance, NP-C Phone: [phone]  Adrian Lang is a 38 y.o. male who presents today for follow up.   Discussed the use of AI scribe software for clinical note transcription with the patient, who gave verbal consent to proceed.  History of Present Illness   The patient, currently on Zoloft  100mg , reports a slight improvement in his symptoms since the last consultation when the dosage was increased from 50mg . He describes the effect as 'taking the edge off,' but does not perceive a significant change. He does not report feeling excessively sedated or 'like a zombie' on the current dosage.  In addition to his mental health concerns, the patient has been dealing with a recent cold. He experienced temporary hearing loss in his left ear, which prompted a course of steroids and Sudafed. The patient reports that these medications seemed to help, and he is now on the tail end of the cold, dealing with residual congestion and drainage. He continues to take Allegra for these symptoms.  The patient also mentions ongoing issues with referrals for a sleep study and urology, both of which are being coordinated through the [facility]. He expresses frustration with the process, particularly the denial of the sleep study referral due to the lack of a previous sleep study. The status of the urology referral is unclear at this time.      Social History   Tobacco Use  Smoking Status Never  Smokeless Tobacco Never    Current Outpatient Medications on File Prior to Visit  Medication Sig Dispense Refill   acetaminophen  (TYLENOL ) 500 MG tablet Take 500 mg by mouth every 6 (six) hours as needed.     albuterol  (PROVENTIL  HFA;VENTOLIN  HFA) 108 (90 Base) MCG/ACT inhaler Inhale 2 puffs into the lungs every 6 (six) hours as needed for wheezing or shortness of breath. 1 Inhaler 2   naproxen  (NAPROSYN ) 500 MG tablet Take 1 tablet (500 mg total) by mouth 2 (two) times daily with a meal. 20 tablet 00   orphenadrine   (NORFLEX ) 100 MG tablet Take 1 tablet (100 mg total) by mouth 2 (two) times daily. 20 tablet 1   sertraline  (ZOLOFT ) 100 MG tablet Take 1 tablet (100 mg total) by mouth daily. 90 tablet 1   Testosterone  Enanthate (XYOSTED ) 75 MG/0.5ML SOAJ Inject 75 mg into the skin once a week. 2 mL 3   triamcinolone cream (KENALOG) 0.1 % Apply topically daily.     No current facility-administered medications on file prior to visit.     ROS see history of present illness  Objective  Physical Exam Vitals:   11/06/23 0830  BP: 130/82  Pulse: 72  Temp: 98.3 F (36.8 C)  SpO2: 97%    BP Readings from Last 3 Encounters:  11/06/23 130/82  10/12/23 129/88  09/28/23 120/78   Wt Readings from Last 3 Encounters:  11/06/23 269 lb (122 kg)  09/28/23 253 lb 3.2 oz (114.9 kg)  08/12/23 253 lb 3.2 oz (114.9 kg)    Physical Exam Constitutional:      General: He is not in acute distress.    Appearance: Normal appearance.  HENT:     Head: Normocephalic.     Right Ear: A middle ear effusion (mucoid fluid) is present. Tympanic membrane is bulging.     Left Ear: A middle ear effusion (clear fluid) is present.  Skin:    General: Skin is warm and dry.  Neurological:     General: No focal deficit present.  Mental Status: He is alert.  Psychiatric:        Mood and Affect: Mood normal.        Behavior: Behavior normal.    Assessment/Plan: Please see individual problem list.  Anxiety and depression Assessment & Plan: Noted improvement with Zoloft  100mg  daily, though not at the desired level of symptom control, with no reported side effects. We will increase Zoloft  to 150mg  daily, taking 100mg  and 50mg  separately. PHQ- 9 and GAD- 10 today. Counseled patient on common side effects. Encouraged to contact if worsening symptoms, unusual behavior changes or suicidal thoughts occur.   Orders: -     Sertraline  HCl; Take 1 tablet (50 mg total) by mouth daily. Take with 100 mg tablet to equal 150 mg daily.   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 See plan for anxiety/depression. Will continue to monitor.   Orders: -     Sertraline  HCl; Take 1 tablet (50 mg total) by mouth daily. Take with 100 mg tablet to equal 150 mg daily.  Dispense: 90 tablet; Refill: 1  Dysfunction of both eustachian tubes Assessment & Plan: He presents with a recent cold, residual congestion, and fluid in ears, but no fever, shortness of breath, or significant cough. We will continue Allegra as needed for congestion and add Flonase  daily to help with ear fluid and congestion.  Orders: -     Fexofenadine -Pseudoephed ER; Take 1 tablet by mouth 2 (two) times daily.  Dispense: 20 tablet; Refill: 0 -     Fluticasone  Propionate; Place 2 sprays into both nostrils daily.  Dispense: 15.8 mL; Refill: 2  OSA (obstructive sleep apnea) Assessment & Plan: He has difficulty obtaining VA authorization for a pulmonology referral for a sleep study, though the urology referral appears to be authorized. We will continue to work with the referral coordinator to resolve issues with the pulmonology referral.    Hypogonadism in male Assessment & Plan: He has difficulty obtaining VA authorization for a pulmonology referral for a sleep study, though the urology referral appears to be authorized. He should check the VA system for authorization and contact the urology office.     Return in about 3 months (around 02/04/2024) for Anxiety/Depression.   Leron Glance, NP-C [facility]

## 2023-11-06 NOTE — Assessment & Plan Note (Signed)
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 Will continue to monitor.

## 2023-11-06 NOTE — Assessment & Plan Note (Signed)
 He presents with a recent cold, residual congestion, and fluid in ears, but no fever, shortness of breath, or significant cough. We will continue Allegra as needed for congestion and add Flonase daily to help with ear fluid and congestion.

## 2023-11-06 NOTE — Assessment & Plan Note (Signed)
 Noted improvement with Zoloft  100mg  daily, though not at the desired level of symptom control, with no reported side effects. We will increase Zoloft  to 150mg  daily, taking 100mg  and 50mg  separately. PHQ- 9 and GAD- 10 today. Counseled patient on common side effects. Encouraged to contact if worsening symptoms, unusual behavior changes or suicidal thoughts occur.

## 2023-11-06 NOTE — Assessment & Plan Note (Addendum)
 He has difficulty obtaining VA authorization for a pulmonology referral for a sleep study, though the urology referral appears to be authorized. We will continue to work with the referral coordinator to resolve issues with the pulmonology referral.

## 2023-11-06 NOTE — Assessment & Plan Note (Signed)
 He has difficulty obtaining VA authorization for a pulmonology referral for a sleep study, though the urology referral appears to be authorized. He should check the VA system for authorization and contact the urology office.

## 2023-11-16 ENCOUNTER — Telehealth: Payer: Self-pay

## 2023-11-16 NOTE — Telephone Encounter (Signed)
 Another fax was received from the [place] stating the same as last fax about Referral denied missing PSG.     [name] what was their fax number as he needs to be seen by Pulmonology for the PSG we cannot submit what we do not have.

## 2023-11-19 NOTE — Telephone Encounter (Signed)
Thank you Ms. Rasheedah    I have faxed back to the [facility] that the PSG is done at the pulmonology office which is the reason for the referral and to please approve. Form was faxed back to the [place] at [phone] with a completed transmission log.

## 2023-11-20 ENCOUNTER — Other Ambulatory Visit: Payer: 59

## 2023-12-01 ENCOUNTER — Other Ambulatory Visit: Payer: Self-pay | Admitting: *Deleted

## 2023-12-01 ENCOUNTER — Telehealth: Payer: Self-pay

## 2023-12-01 MED ORDER — XYOSTED 75 MG/0.5ML ~~LOC~~ SOAJ: 75.0000 mg | SUBCUTANEOUS | 3 refills | Status: DC

## 2023-12-01 NOTE — Telephone Encounter (Signed)
Pt calls triage line and states that his RX for Barbaraann Cao was sent to [facility], however it should have been sent to [facility]. Pharmacy updated in chart. Please send.

## 2023-12-27 DIAGNOSIS — G4733 Obstructive sleep apnea (adult) (pediatric): Secondary | ICD-10-CM | POA: Diagnosis not present

## 2023-12-30 ENCOUNTER — Other Ambulatory Visit: Payer: Self-pay | Admitting: Nurse Practitioner

## 2023-12-30 DIAGNOSIS — F32A Depression, unspecified: Secondary | ICD-10-CM

## 2024-02-04 ENCOUNTER — Ambulatory Visit: Payer: 59 | Admitting: Nurse Practitioner

## 2024-02-11 ENCOUNTER — Other Ambulatory Visit: Payer: Self-pay | Admitting: Nurse Practitioner

## 2024-02-11 DIAGNOSIS — H6993 Unspecified Eustachian tube disorder, bilateral: Secondary | ICD-10-CM

## 2024-02-15 ENCOUNTER — Telehealth: Payer: Self-pay | Admitting: Nurse Practitioner

## 2024-02-15 ENCOUNTER — Encounter: Payer: Self-pay | Admitting: Nurse Practitioner

## 2024-02-15 ENCOUNTER — Ambulatory Visit: Admitting: Nurse Practitioner

## 2024-02-15 VITALS — BP 128/72 | HR 83 | Temp 98.1°F | Ht 71.0 in | Wt 245.8 lb

## 2024-02-15 DIAGNOSIS — F32A Depression, unspecified: Secondary | ICD-10-CM

## 2024-02-15 DIAGNOSIS — F4310 Post-traumatic stress disorder, unspecified: Secondary | ICD-10-CM

## 2024-02-15 DIAGNOSIS — G4733 Obstructive sleep apnea (adult) (pediatric): Secondary | ICD-10-CM | POA: Diagnosis not present

## 2024-02-15 DIAGNOSIS — R1904 Left lower quadrant abdominal swelling, mass and lump: Secondary | ICD-10-CM

## 2024-02-15 DIAGNOSIS — F419 Anxiety disorder, unspecified: Secondary | ICD-10-CM

## 2024-02-15 MED ORDER — BUPROPION HCL ER (XL) 150 MG PO TB24: 150.0000 mg | ORAL_TABLET | Freq: Every day | ORAL | 3 refills | Status: AC

## 2024-02-15 NOTE — Telephone Encounter (Signed)
 lft pt vm to call to [phone] press option 4 and then 2 to sch . thanks

## 2024-02-15 NOTE — Progress Notes (Unsigned)
 Bethanie Dicker, NP-C Phone: [phone]  Adrian Lang is a 38 y.o. male who presents today for follow up.   Discussed the use of AI scribe software for clinical note transcription with the patient, who gave verbal consent to proceed.  History of Present Illness   Adrian Lang is a 38 year old male who presents for follow-up of anxiety and depression.  He experiences persistent anxiety and depression despite taking Zoloft 150 mg daily. He has tried taking the medication both in the morning and evening without noticing any difference in his symptoms. The intensity of his anxiety and depression remains unchanged, with only occasional days being slightly better. There has been no significant improvement or worsening of his condition. His life stressors have remained consistent, with no significant changes. He recently completed a 22-week class, which was stressful but manageable. He also faces stress related to his children, including an older son who recently had a crisis involving self-harm and a younger son with autism and another with severe ADHD.  He experiences abdominal pain due to hard spots in his stomach, previously evaluated and attributed to fat deposits. However, one spot on the left side has become larger and more painful, raising concerns about a possible hernia. The pain is tender but not excruciating, and he does not notice any bulge when lifting weights. No issues with bowel movements, appetite, or other gastrointestinal symptoms, maintaining a consistent diet.  He has a history of sleep apnea and mentions that the [place] is handling his CPAP therapy. He has not received any recent communication regarding this but acknowledges that he may have missed calls due to screening his phone.      Social History   Tobacco Use  Smoking Status Never  Smokeless Tobacco Never    Current Outpatient Medications on File Prior to Visit  Medication Sig Dispense Refill    acetaminophen (TYLENOL) 500 MG tablet Take 500 mg by mouth every 6 (six) hours as needed.     albuterol (PROVENTIL HFA;VENTOLIN HFA) 108 (90 Base) MCG/ACT inhaler Inhale 2 puffs into the lungs every 6 (six) hours as needed for wheezing or shortness of breath. 1 Inhaler 2   fexofenadine-pseudoephedrine (ALLEGRA-D) 60-120 MG 12 hr tablet Take 1 tablet by mouth 2 (two) times daily. 20 tablet 0   fluticasone (FLONASE) 50 MCG/ACT nasal spray SPRAY 2 SPRAYS INTO EACH NOSTRIL EVERY DAY 16 mL 2   naproxen (NAPROSYN) 500 MG tablet Take 1 tablet (500 mg total) by mouth 2 (two) times daily with a meal. 20 tablet 00   orphenadrine (NORFLEX) 100 MG tablet Take 1 tablet (100 mg total) by mouth 2 (two) times daily. 20 tablet 1   sertraline (ZOLOFT) 100 MG tablet TAKE 1 TABLET BY MOUTH EVERY DAY 90 tablet 1   sertraline (ZOLOFT) 50 MG tablet Take 1 tablet (50 mg total) by mouth daily. Take with 100 mg tablet to equal 150 mg daily. 90 tablet 1   Testosterone Enanthate (XYOSTED) 75 MG/0.5ML SOAJ Inject 75 mg into the skin once a week. 2 mL 3   triamcinolone cream (KENALOG) 0.1 % Apply topically daily.     No current facility-administered medications on file prior to visit.    ROS see history of present illness  Objective  Physical Exam Vitals:   02/15/24 0808  BP: 128/72  Pulse: 83  Temp: 98.1 F (36.7 C)  SpO2: 95%    BP Readings from Last 3 Encounters:  02/15/24 128/72  11/06/23 130/82  10/12/23  129/88   Wt Readings from Last 3 Encounters:  02/15/24 245 lb 12.8 oz (111.5 kg)  11/06/23 269 lb (122 kg)  09/28/23 253 lb 3.2 oz (114.9 kg)    Physical Exam Constitutional:      General: He is not in acute distress.    Appearance: Normal appearance.  HENT:     Head: Normocephalic.  Cardiovascular:     Rate and Rhythm: Normal rate and regular rhythm.     Heart sounds: Normal heart sounds.  Pulmonary:     Effort: Pulmonary effort is normal.     Breath sounds: Normal breath sounds.   Abdominal:     General: Abdomen is flat. Bowel sounds are normal.     Palpations: Abdomen is soft. There is mass (left lower quadrant, tender).  Skin:    General: Skin is warm and dry.  Neurological:     General: No focal deficit present.     Mental Status: He is alert.  Psychiatric:        Mood and Affect: Mood normal.        Behavior: Behavior normal.     Assessment/Plan: Please see individual problem list.  Anxiety and depression Assessment & Plan: There is no significant improvement with Zoloft 150 mg, and symptoms persist due to family stressors. PHQ- 10 and GAD- 9 today. We will add Wellbutrin XL 150 mg once daily in the morning alongside Zoloft 150 mg daily. Counseled patient on common side effects. Encouraged to contact if worsening symptoms, unusual behavior changes or suicidal thoughts occur. We will follow up via phone call in approximately one month to assess mood.   Orders: -     buPROPion HCl ER (XL); Take 1 tablet (150 mg total) by mouth daily.  Dispense: 90 tablet; Refill: 3  Abdominal mass, left lower quadrant Assessment & Plan: He presents with a painful, tender, non-reducible mass on the left abdomen. The differential diagnosis includes hernia or lipoma. An abdominal ultrasound is necessary to determine the nature of the mass. Order an abdominal ultrasound. Further work up pending results.   Orders: -     US  Abdomen Limited; Future  OSA (obstructive sleep apnea) Assessment & Plan: He is awaiting VA follow-up for CPAP therapy. The VA has located his sleep study records and plans to contact him for CPAP scheduling. Provide VA contact information for CPAP therapy clinic scheduling.   PTSD (post-traumatic stress disorder) -     buPROPion HCl ER (XL); Take 1 tablet (150 mg total) by mouth daily.  Dispense: 90 tablet; Refill: 3     Return in about 6 months (around 08/11/2024) for Annual Exam, sooner as needed.   Bluford Burkitt, NP-C [facility] -  [facility]

## 2024-02-18 ENCOUNTER — Encounter: Payer: Self-pay | Admitting: Nurse Practitioner

## 2024-02-18 DIAGNOSIS — R1904 Left lower quadrant abdominal swelling, mass and lump: Secondary | ICD-10-CM | POA: Insufficient documentation

## 2024-02-18 NOTE — Assessment & Plan Note (Addendum)
 He is awaiting VA follow-up for CPAP therapy. The [facility] has located his sleep study records and plans to contact him for CPAP scheduling. Provide VA contact information for CPAP therapy clinic scheduling.

## 2024-02-18 NOTE — Assessment & Plan Note (Signed)
 There is no significant improvement with Zoloft 150 mg, and symptoms persist due to family stressors. PHQ- 10 and GAD- 9 today. We will add Wellbutrin XL 150 mg once daily in the morning alongside Zoloft 150 mg daily. Counseled patient on common side effects. Encouraged to contact if worsening symptoms, unusual behavior changes or suicidal thoughts occur. We will follow up via phone call in approximately one month to assess mood.

## 2024-02-18 NOTE — Assessment & Plan Note (Signed)
 He presents with a painful, tender, non-reducible mass on the left abdomen. The differential diagnosis includes hernia or lipoma. An abdominal ultrasound is necessary to determine the nature of the mass. Order an abdominal ultrasound. Further work up pending results.

## 2024-03-04 ENCOUNTER — Encounter: Payer: Self-pay | Admitting: Urology

## 2024-03-21 ENCOUNTER — Other Ambulatory Visit: Payer: Self-pay | Admitting: *Deleted

## 2024-03-22 MED ORDER — XYOSTED 75 MG/0.5ML ~~LOC~~ SOAJ: 75.0000 mg | SUBCUTANEOUS | 3 refills | Status: DC

## 2024-04-27 ENCOUNTER — Other Ambulatory Visit: Payer: Self-pay | Admitting: Nurse Practitioner

## 2024-04-27 DIAGNOSIS — F4310 Post-traumatic stress disorder, unspecified: Secondary | ICD-10-CM

## 2024-04-27 DIAGNOSIS — F419 Anxiety disorder, unspecified: Secondary | ICD-10-CM

## 2024-05-02 ENCOUNTER — Other Ambulatory Visit: Payer: Self-pay

## 2024-05-11 ENCOUNTER — Other Ambulatory Visit: Payer: Self-pay

## 2024-05-11 ENCOUNTER — Encounter: Payer: Self-pay | Admitting: Urology

## 2024-05-11 DIAGNOSIS — E291 Testicular hypofunction: Secondary | ICD-10-CM

## 2024-05-11 DIAGNOSIS — R7989 Other specified abnormal findings of blood chemistry: Secondary | ICD-10-CM

## 2024-05-11 NOTE — Addendum Note (Signed)
 Addended by: EFFIE POWELL DEL on: 05/11/2024 08:41 AM   Modules accepted: Orders

## 2024-05-13 ENCOUNTER — Other Ambulatory Visit

## 2024-05-13 DIAGNOSIS — E291 Testicular hypofunction: Secondary | ICD-10-CM

## 2024-05-13 DIAGNOSIS — R7989 Other specified abnormal findings of blood chemistry: Secondary | ICD-10-CM

## 2024-05-14 LAB — TESTOSTERONE: Testosterone: 274 ng/dL (ref 264–916)

## 2024-05-14 LAB — HEMOGLOBIN AND HEMATOCRIT, BLOOD
Hematocrit: 45.6 % (ref 37.5–51.0)
Hemoglobin: 14.5 g/dL (ref 13.0–17.7)

## 2024-05-16 ENCOUNTER — Encounter: Payer: Self-pay | Admitting: Urology

## 2024-05-16 ENCOUNTER — Ambulatory Visit (INDEPENDENT_AMBULATORY_CARE_PROVIDER_SITE_OTHER): Admitting: Urology

## 2024-05-16 VITALS — BP 126/80 | HR 61 | Ht 71.0 in | Wt 230.0 lb

## 2024-05-16 DIAGNOSIS — E291 Testicular hypofunction: Secondary | ICD-10-CM

## 2024-05-16 NOTE — Progress Notes (Signed)
 05/16/2024 8:42 AM   Adrian Lang [birth year] [id number]  Referring provider: Claudene Tanda POUR, PA-C [address] [place],  [place] [address]  Chief Complaint  Patient presents with   Hypogonadism in male   Urologic history: 1. Hypogonadism Decreased libido and increased fatigue Testosterone  level low at 227 ng/dL in 87/7976  HPI: Adrian Lang is a 38 y.o. male presents for follow-up of hypogonadism.   Switch from topical gel to Xyosted  last year Testosterone  level 05/13/2024 was 274 and he had injected 5 days prior.  H/H was normal. Still of tiredness, fatigue and decreased libido   PMH: Past Medical History:  Diagnosis Date   Arthritis    left ankle   Depression    Elevated LDL cholesterol level    GERD (gastroesophageal reflux disease)    can cause wheezing if gets bad   Low back pain    Obesity    Rectal bleeding    Sleep apnea    uses cpap.  has mild case of sleep apnea   Wheezing    at night mostly    Surgical History: Past Surgical History:  Procedure Laterality Date   ANKLE ARTHROSCOPY Left 10/06/2018   Procedure: ARTHROSCOPIC DEBRIDEMENT OF PAINFUL SCAR TISSUE FROM LEFT ANKLE RETAINED ORTHOPEDIC HARDWARE;  Surgeon: Adrian Norleen PARAS, MD;  Location: [facility];  Service: Orthopedics;  Laterality: Left;  SMALL JOINT ARTHROSCOPY SYSTEM WITH THE 2.9 MM FULL RADIUS RESECTOR   ANKLE ARTHROSCOPY Left 04/27/2019   Procedure: ANKLE ARTHROSCOPY LEFT, WITH DEBRIDEMENT AND EXCISION OF OSTEOPHYTES;  Surgeon: Adrian Norleen PARAS, MD;  Location: [facility];  Service: Orthopedics;  Laterality: Left;   HARDWARE REMOVAL Left 04/27/2019   Procedure: HARDWARE REMOVAL;  Surgeon: Adrian Norleen PARAS, MD;  Location: [facility];  Service: Orthopedics;  Laterality: Left;   KNEE SURGERY Right 2012   knee scope   ORIF ANKLE FRACTURE Left 07/29/2017   Procedure: OPEN REDUCTION INTERNAL FIXATION (ORIF) ANKLE FRACTURE;  Surgeon: Adrian Norleen PARAS, MD;  Location: [facility];   Service: Orthopedics;  Laterality: Left;   TONSILECTOMY, ADENOIDECTOMY, BILATERAL MYRINGOTOMY AND TUBES     variceal vein repair Left    When a teenager    Home Medications:  Allergies as of 05/16/2024   No Known Allergies      Medication List        Accurate as of May 16, 2024  8:42 AM. If you have any questions, ask your nurse or doctor.          acetaminophen  500 MG tablet Commonly known as: TYLENOL  Take 500 mg by mouth every 6 (six) hours as needed.   albuterol  108 (90 Base) MCG/ACT inhaler Commonly known as: VENTOLIN  HFA Inhale 2 puffs into the lungs every 6 (six) hours as needed for wheezing or shortness of breath.   buPROPion  150 MG 24 hr tablet Commonly known as: Wellbutrin  XL Take 1 tablet (150 mg total) by mouth daily.   fexofenadine -pseudoephedrine 60-120 MG 12 hr tablet Commonly known as: ALLEGRA-D Take 1 tablet by mouth 2 (two) times daily.   fluticasone  50 MCG/ACT nasal spray Commonly known as: FLONASE  SPRAY 2 SPRAYS INTO EACH NOSTRIL EVERY DAY   naproxen  500 MG tablet Commonly known as: Naprosyn  Take 1 tablet (500 mg total) by mouth 2 (two) times daily with a meal.   orphenadrine  100 MG tablet Commonly known as: NORFLEX  Take 1 tablet (100 mg total) by mouth 2 (two) times daily.   sertraline  100 MG tablet Commonly  known as: ZOLOFT  TAKE 1 TABLET BY MOUTH EVERY DAY   sertraline  50 MG tablet Commonly known as: ZOLOFT  TAKE 1 TABLET (50 MG TOTAL) BY MOUTH DAILY. TAKE WITH 100 MG TABLET TO EQUAL 150 MG DAILY.   triamcinolone cream 0.1 % Commonly known as: KENALOG Apply topically daily.   Xyosted  75 MG/0.5ML Soaj Generic drug: Testosterone  Enanthate Inject 75 mg into the skin once a week.        Allergies: No Known Allergies  Family History: Family History  Problem Relation Age of Onset   Early death Mother    Cancer Mother    Breast cancer Mother    Diabetes Father    Hypertension Father    Hyperlipidemia Father    Cancer  Brother    Cancer Maternal Grandmother     Social History:  reports that he has never smoked. He has never used smokeless tobacco. He reports current alcohol use. He reports that he does not use drugs.   Physical Exam: BP 126/80   Pulse 61   Ht 5' 11 (1.803 m)   Wt 230 lb (104.3 kg)   BMI 32.08 kg/m   Constitutional:  Alert and oriented, No acute distress. HEENT: [place] AT Respiratory: Normal respiratory effort, no increased work of breathing. Psychiatric: Normal mood and affect.   Assessment & Plan:    1. Hypogonadism Low levels on Xyosted  75 mg Will increase to Xyosted  100 mg He does have ~2 months of the 75 mg dose remaining.  His levels are fairly low and he will inject 150 mg weekly and we will recheck a testosterone  level in 4 weeks   Adrian JAYSON Barba, MD  [facility] [address] [place], [place] [address] [phone]

## 2024-05-17 ENCOUNTER — Other Ambulatory Visit: Payer: Self-pay

## 2024-05-18 ENCOUNTER — Encounter: Payer: Self-pay | Admitting: Physician Assistant

## 2024-05-18 ENCOUNTER — Ambulatory Visit: Payer: Self-pay | Admitting: Physician Assistant

## 2024-05-18 VITALS — BP 138/78 | HR 82 | Temp 97.8°F | Resp 14

## 2024-05-18 DIAGNOSIS — L237 Allergic contact dermatitis due to plants, except food: Secondary | ICD-10-CM

## 2024-05-18 MED ORDER — METHYLPREDNISOLONE 4 MG PO TBPK
ORAL_TABLET | ORAL | 0 refills | Status: AC
Start: 2024-05-18 — End: ?

## 2024-05-18 MED ORDER — METHYLPREDNISOLONE SODIUM SUCC 40 MG IJ SOLR
40.0000 mg | Freq: Once | INTRAMUSCULAR | Status: AC
Start: 2024-05-18 — End: 2024-05-18
  Administered 2024-05-18: 40 mg via INTRAMUSCULAR

## 2024-05-18 MED ORDER — HYDROXYZINE PAMOATE 25 MG PO CAPS: 25.0000 mg | ORAL_CAPSULE | Freq: Three times a day (TID) | ORAL | 0 refills | Status: AC | PRN

## 2024-05-18 NOTE — Progress Notes (Signed)
 C/O poison ivy type rash and noted rash on RLL and left arm and stated feels a spot on right face.  Stated it always gets worse and spreads.  He regularly uses the [facility] to prevent.

## 2024-05-18 NOTE — Progress Notes (Signed)
   Subjective: Contact dermatitis    Patient ID: Adrian Lang, male    DOB: 1986-02-25, 38 y.o.   MRN: [id number]  HPI Patient complains of physical papular lesions on the lower and upper extremities.  Patient became in contact with poison [place] approximately 5 days ago.   Review of Systems Anxiety/depression, hypogonadism, and obstructive sleep apnea.    Objective:   Physical Exam BP 138/78  Pulse Rate 82  Temp 97.8 F (36.6 C)  Resp 14  SpO2 95 %  Patient has diffuse vascular/ papular lesions on upper and lower extremities.  Signs and symptoms of excoriation.  No signs of secondary infection.      Assessment & Plan:   Patient given Solu-Medrol  40 mg IM followed by prescription for Atarax  and Medrol  Dosepak.  Advised to follow-up if no improvement or worsening complaint.

## 2024-05-20 ENCOUNTER — Ambulatory Visit: Payer: Self-pay | Admitting: Urology

## 2024-05-27 ENCOUNTER — Encounter: Payer: Self-pay | Admitting: Nurse Practitioner

## 2024-06-16 DIAGNOSIS — G4733 Obstructive sleep apnea (adult) (pediatric): Secondary | ICD-10-CM | POA: Diagnosis not present

## 2024-06-21 ENCOUNTER — Other Ambulatory Visit

## 2024-06-21 DIAGNOSIS — E291 Testicular hypofunction: Secondary | ICD-10-CM | POA: Diagnosis not present

## 2024-06-22 LAB — TESTOSTERONE: Testosterone: 705 ng/dL (ref 264–916)

## 2024-06-24 ENCOUNTER — Ambulatory Visit: Payer: Self-pay | Admitting: Urology

## 2024-06-24 ENCOUNTER — Other Ambulatory Visit: Payer: Self-pay | Admitting: Urology

## 2024-06-24 MED ORDER — XYOSTED 100 MG/0.5ML ~~LOC~~ SOAJ
SUBCUTANEOUS | 3 refills | Status: DC
Start: 2024-06-24 — End: 2024-09-19

## 2024-07-28 ENCOUNTER — Telehealth: Payer: Self-pay | Admitting: Nurse Practitioner

## 2024-07-28 NOTE — Telephone Encounter (Signed)
 Redell Belvie Creed  P [facility] Admin18 minutes ago (9:06 AM)   Appointment canceled for Rykker Coviello ([id number]) Visit type: PHYSICAL 08/04/2024 10:00 AM (20 minutes) with Leron Glance in [facility]   Reason for cancellation: Canceled via MyChart   Patient comments: It's nothing that your clinic has done. [facility] just [facility] get [facility] to [facility] the [facility] that I need them to do. So I'm having to transition my primary care back to the [place] so that they can do the referrals for me. Thanks for all that you've done.

## 2024-08-04 ENCOUNTER — Encounter: Admitting: Nurse Practitioner

## 2024-09-19 ENCOUNTER — Other Ambulatory Visit: Payer: Self-pay | Admitting: *Deleted

## 2024-09-20 MED ORDER — XYOSTED 100 MG/0.5ML ~~LOC~~ SOAJ: SUBCUTANEOUS | 3 refills | Status: AC

## 2024-09-20 NOTE — Telephone Encounter (Signed)
 Schedule lab visit January 2026 for testosterone , H/H and office visit July 2026 testosterone , H/H, PSA

## 2024-09-25 ENCOUNTER — Other Ambulatory Visit: Payer: Self-pay | Admitting: Nurse Practitioner

## 2024-09-25 DIAGNOSIS — F419 Anxiety disorder, unspecified: Secondary | ICD-10-CM

## 2024-11-10 ENCOUNTER — Other Ambulatory Visit: Payer: Self-pay

## 2024-11-10 DIAGNOSIS — E291 Testicular hypofunction: Secondary | ICD-10-CM

## 2024-11-21 ENCOUNTER — Other Ambulatory Visit

## 2024-11-21 DIAGNOSIS — E291 Testicular hypofunction: Secondary | ICD-10-CM

## 2024-11-22 ENCOUNTER — Other Ambulatory Visit: Payer: Self-pay | Admitting: *Deleted

## 2024-11-22 ENCOUNTER — Ambulatory Visit: Payer: Self-pay | Admitting: Urology

## 2024-11-22 DIAGNOSIS — E291 Testicular hypofunction: Secondary | ICD-10-CM

## 2024-11-22 LAB — TESTOSTERONE: Testosterone: 795 ng/dL (ref 264–916)

## 2024-11-22 LAB — HEMOGLOBIN AND HEMATOCRIT, BLOOD
Hematocrit: 51.6 % — ABNORMAL HIGH (ref 37.5–51.0)
Hemoglobin: 17.2 g/dL (ref 13.0–17.7)

## 2024-11-22 NOTE — Progress Notes (Signed)
 Notified patient as instructed, patient would like a ref to hematology . I placed that referral today / '

## 2024-12-02 ENCOUNTER — Encounter: Payer: Self-pay | Admitting: Oncology

## 2024-12-02 ENCOUNTER — Inpatient Hospital Stay

## 2024-12-02 ENCOUNTER — Inpatient Hospital Stay: Attending: Oncology | Admitting: Oncology

## 2024-12-02 VITALS — BP 135/84 | HR 102 | Temp 97.6°F | Resp 18 | Ht 71.0 in | Wt 264.6 lb

## 2024-12-02 VITALS — BP 120/77 | HR 83 | Resp 18

## 2024-12-02 DIAGNOSIS — D751 Secondary polycythemia: Secondary | ICD-10-CM | POA: Diagnosis present

## 2024-12-02 DIAGNOSIS — E291 Testicular hypofunction: Secondary | ICD-10-CM | POA: Diagnosis not present

## 2024-12-02 LAB — CBC WITH DIFFERENTIAL/PLATELET
Abs Immature Granulocytes: 0.03 10*3/uL (ref 0.00–0.07)
Basophils Absolute: 0.1 10*3/uL (ref 0.0–0.1)
Basophils Relative: 1 %
Eosinophils Absolute: 0.3 10*3/uL (ref 0.0–0.5)
Eosinophils Relative: 5 %
HCT: 50.8 % (ref 39.0–52.0)
Hemoglobin: 17 g/dL (ref 13.0–17.0)
Immature Granulocytes: 0 %
Lymphocytes Relative: 16 %
Lymphs Abs: 1.1 10*3/uL (ref 0.7–4.0)
MCH: 28.4 pg (ref 26.0–34.0)
MCHC: 33.5 g/dL (ref 30.0–36.0)
MCV: 84.9 fL (ref 80.0–100.0)
Monocytes Absolute: 0.6 10*3/uL (ref 0.1–1.0)
Monocytes Relative: 8 %
Neutro Abs: 5.1 10*3/uL (ref 1.7–7.7)
Neutrophils Relative %: 70 %
Platelets: 214 10*3/uL (ref 150–400)
RBC: 5.98 MIL/uL — ABNORMAL HIGH (ref 4.22–5.81)
RDW: 13.3 % (ref 11.5–15.5)
WBC: 7.2 10*3/uL (ref 4.0–10.5)
nRBC: 0 % (ref 0.0–0.2)

## 2024-12-02 NOTE — Patient Instructions (Signed)

## 2024-12-02 NOTE — Progress Notes (Signed)
 "  Hematology/Oncology Consult note [facility] [phone] [phone]  Patient Care Team: Trosper, Jessica D, PA-C as PCP - General (Physician Assistant) Melanee Annah BROCKS, MD as Consulting Physician (Oncology)   Name of the patient: Adrian Lang  [id number]  04/26/1986    Reason for referral-polycythemia   Referring physician-Dr. 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  He began weekly testosterone  injections (100 mg) in July 2025 for hypogonadism. Prior to therapy, his hemoglobin was approximately 14 g/dL. Since starting testosterone , his hemoglobin has increased to 17.1-17.2 g/dL and hematocrit to 48.3%.  He denies symptoms suggestive of thrombotic complications and has no history of venous or arterial thrombosis. He reports no significant changes since starting testosterone , aside from improved energy and sexual health.       ECOG PS- 0  Pain scale- 0   Review of systems- Review of Systems  Constitutional:  Positive for malaise/fatigue. Negative for chills, fever and weight loss.  HENT:  Negative for congestion, ear discharge and nosebleeds.   Eyes:  Negative for blurred vision.  Respiratory:  Negative for cough, hemoptysis, sputum production, shortness of breath and wheezing.   Cardiovascular:  Negative for chest pain, palpitations, orthopnea and claudication.  Gastrointestinal:  Negative for abdominal pain, blood in stool, constipation, diarrhea, heartburn, melena, nausea and vomiting.  Genitourinary:  Negative for dysuria, flank pain, frequency, hematuria and urgency.  Musculoskeletal:  Negative for back pain, joint pain and myalgias.  Skin:  Negative for rash.  Neurological:  Negative for dizziness, tingling, focal  weakness, seizures, weakness and headaches.  Endo/Heme/Allergies:  Does not bruise/bleed easily.  Psychiatric/Behavioral:  Negative for depression and suicidal ideas. The patient does not have insomnia.     Allergies[1]  Patient Active Problem List   Diagnosis Date Noted   Abdominal mass, left lower quadrant 02/18/2024   Dysfunction of both eustachian tubes 11/06/2023   OSA (obstructive sleep apnea) 08/25/2023   Hypogonadism in male 08/12/2023   Chronic bilateral low back pain without sciatica 08/12/2023   Encounter for routine adult medical exam with abnormal findings 08/12/2023   PTSD (post-traumatic stress disorder) 08/12/2023   Anxiety and depression 08/12/2023   Low testosterone  in male 10/10/2022   Pain in joint, lower leg 12/24/2020   Reason for consultation 12/24/2020   Osteochondral defect of talus 04/27/2019   Status post ORIF of fracture of ankle 10/07/2018   Ankle fracture 07/28/2017   H/O arthroscopy of right knee 04/26/2012   H/O medial meniscus repair of right knee 03/25/2012   Tear of lateral cartilage or meniscus of knee, current 03/14/2012   Other personal history presenting hazards to health 11/03/2002     Past Medical History:  Diagnosis Date   Arthritis    left ankle   Depression    Elevated LDL cholesterol level    GERD (gastroesophageal reflux disease)    can cause wheezing if gets bad   Low back pain    Obesity    Rectal bleeding    Sleep apnea    uses cpap.  has mild case of sleep apnea   Wheezing    at night mostly     Past Surgical History:  Procedure Laterality Date   ANKLE ARTHROSCOPY Left 10/06/2018   Procedure: ARTHROSCOPIC DEBRIDEMENT OF PAINFUL SCAR TISSUE FROM LEFT ANKLE RETAINED  ORTHOPEDIC HARDWARE;  Surgeon: Edie Norleen PARAS, MD;  Location: [facility];  Service: Orthopedics;  Laterality: Left;  SMALL JOINT ARTHROSCOPY SYSTEM WITH THE 2.9 MM FULL RADIUS RESECTOR   ANKLE ARTHROSCOPY Left 04/27/2019   Procedure: ANKLE  ARTHROSCOPY LEFT, WITH DEBRIDEMENT AND EXCISION OF OSTEOPHYTES;  Surgeon: Edie Norleen PARAS, MD;  Location: [facility];  Service: Orthopedics;  Laterality: Left;   HARDWARE REMOVAL Left 04/27/2019   Procedure: HARDWARE REMOVAL;  Surgeon: Edie Norleen PARAS, MD;  Location: [facility];  Service: Orthopedics;  Laterality: Left;   KNEE SURGERY Right 2012   knee scope   ORIF ANKLE FRACTURE Left 07/29/2017   Procedure: OPEN REDUCTION INTERNAL FIXATION (ORIF) ANKLE FRACTURE;  Surgeon: Edie Norleen PARAS, MD;  Location: [facility];  Service: Orthopedics;  Laterality: Left;   TONSILECTOMY, ADENOIDECTOMY, BILATERAL MYRINGOTOMY AND TUBES     variceal vein repair Left    When a teenager    Social History   Socioeconomic History   Marital status: Married    Spouse name: nicole   Number of children: 4   Years of education: Not on file   Highest education level: Not on file  Occupational History   Not on file  Tobacco Use   Smoking status: Never   Smokeless tobacco: Never  Vaping Use   Vaping status: Never Used  Substance and Sexual Activity   Alcohol use: Not Currently    Comment: occasionally   Drug use: Never   Sexual activity: Yes  Other Topics Concern   Not on file  Social History Narrative   Not on file   Social Drivers of Health   Tobacco Use: Low Risk (12/02/2024)   Patient History    Smoking Tobacco Use: Never    Smokeless Tobacco Use: Never    Passive Exposure: Not on file  Financial Resource Strain: Not on file  Food Insecurity: No Food Insecurity (12/02/2024)   Epic    Worried About Programme Researcher, Broadcasting/film/video in the Last Year: Never true    Ran Out of Food in the Last Year: Never true  Transportation Needs: No Transportation Needs (12/02/2024)   Epic    Lack of Transportation (Medical): No    Lack of Transportation (Non-Medical): No  Physical Activity: Not on file  Stress: Not on file  Social Connections: Not on file  Intimate Partner Violence: Not At Risk (12/02/2024)   Epic    Fear of Current  or Ex-Partner: No    Emotionally Abused: No    Physically Abused: No    Sexually Abused: No  Depression (PHQ2-9): Low Risk (12/02/2024)   Depression (PHQ2-9)    PHQ-2 Score: 0  Alcohol Screen: Not on file  Housing: Low Risk (12/02/2024)   Epic    Unable to Pay for Housing in the Last Year: No    Number of Times Moved in the Last Year: 0    Homeless in the Last Year: No  Utilities: Not At Risk (12/02/2024)   Epic    Threatened with loss of utilities: No  Health Literacy: Not on file     Family History  Problem Relation Age of Onset   Early death Mother    Cancer Mother    Breast cancer Mother    Diabetes Father    Hypertension Father    Hyperlipidemia Father    Cancer Brother    Cancer Maternal Grandmother     Current Medications[2]   Physical exam:  Vitals:   12/02/24 1353  BP:  135/84  Pulse: (!) 102  Resp: 18  Temp: 97.6 F (36.4 C)  TempSrc: Tympanic  SpO2: 96%  Weight: 264 lb 9.6 oz (120 kg)  Height: 5' 11 (1.803 m)   Physical Exam Cardiovascular:     Rate and Rhythm: Normal rate and regular rhythm.     Heart sounds: Normal heart sounds.  Pulmonary:     Effort: Pulmonary effort is normal.     Breath sounds: Normal breath sounds.  Abdominal:     General: Bowel sounds are normal.     Palpations: Abdomen is soft.     Comments: No palpable hepatosplenomegaly  Skin:    General: Skin is warm and dry.  Neurological:     Mental Status: He is alert and oriented to person, place, and time.           Latest Ref Rng & Units 08/12/2023    2:47 PM  CMP  Glucose 70 - 99 mg/dL 94   BUN 6 - 23 mg/dL 23   Creatinine 9.59 - 1.50 mg/dL 8.87   Sodium 864 - 854 mEq/L 139   Potassium 3.5 - 5.1 mEq/L 4.1   Chloride 96 - 112 mEq/L 101   CO2 19 - 32 mEq/L 31   Calcium 8.4 - 10.5 mg/dL 9.6   Total Protein 6.0 - 8.3 g/dL 6.5   Total Bilirubin 0.2 - 1.2 mg/dL 1.0   Alkaline Phos 39 - 117 U/L 47   AST 0 - 37 U/L 19   ALT 0 - 53 U/L 27       Latest Ref Rng &  Units 11/21/2024    8:15 AM  CBC  Hemoglobin 13.0 - 17.7 g/dL 82.7   Hematocrit 62.4 - 51.0 % 51.6    Assessment and plan- Patient is a 39 y.o. male referred for polycythemia likely secondary from testosterone  replacement therapy  Assessment and Plan    Secondary polycythemia Hemoglobin and hematocrit increased post-testosterone  initiation, indicating secondary polycythemia. Polycythemia vera unlikely but requires exclusion. Asymptomatic without thrombotic events. Aim to maintain hematocrit <50%. - Ordered labs to exclude polycythemia vera and assess hemoglobin/hematocrit including JAK2, CALR, MPL mutation and exon 12 mutation - Therapeutic phlebotomy today if hematocrit >50%. - Repeat phlebotomy in two weeks if indicated. - Monitor hemoglobin/hematocrit at two, six, and ten weeks. - Instructed follow-up with nursing for phlebotomy instructions post-labs.         Thank you for this kind referral and the opportunity to participate in the care of this  Patient   Visit Diagnosis 1. Polycythemia, secondary     Dr. Annah Skene, MD, MPH [facility] [phone] 12/02/2024                   [1] No Known Allergies [2]  Current Outpatient Medications:    acetaminophen  (TYLENOL ) 500 MG tablet, Take 500 mg by mouth every 6 (six) hours as needed., Disp: , Rfl:    albuterol  (PROVENTIL  HFA;VENTOLIN  HFA) 108 (90 Base) MCG/ACT inhaler, Inhale 2 puffs into the lungs every 6 (six) hours as needed for wheezing or shortness of breath., Disp: 1 Inhaler, Rfl: 2   baclofen (LIORESAL) 10 MG tablet, Take by mouth., Disp: , Rfl:    buPROPion  (WELLBUTRIN  XL) 150 MG 24 hr tablet, Take 1 tablet (150 mg total) by mouth daily., Disp: 90 tablet, Rfl: 3   DULoxetine (CYMBALTA) 60 MG capsule, Take 60 mg by mouth daily., Disp: , Rfl:    fluticasone  (FLONASE ) 50  MCG/ACT nasal spray, SPRAY 2 SPRAYS INTO EACH NOSTRIL EVERY DAY, Disp: 16 mL, Rfl: 2   gabapentin (NEURONTIN)  300 MG capsule, Take 300 mg by mouth at bedtime., Disp: , Rfl:    hydrOXYzine  (VISTARIL ) 25 MG capsule, Take 1 capsule (25 mg total) by mouth every 8 (eight) hours as needed., Disp: 30 capsule, Rfl: 0   naproxen  (NAPROSYN ) 500 MG tablet, Take 1 tablet (500 mg total) by mouth 2 (two) times daily with a meal. (Patient taking differently: Take 500 mg by mouth as needed.), Disp: 20 tablet, Rfl: 00   SUMAtriptan (IMITREX) 5 MG/ACT nasal spray, Place 1 spray into the nose every 2 (two) hours as needed for migraine., Disp: , Rfl:    Testosterone  Enanthate (XYOSTED ) 100 MG/0.5ML SOAJ, 100 mg subcutaneous weekly, Disp: 1.96 mL, Rfl: 3   triamcinolone cream (KENALOG) 0.1 %, Apply topically daily., Disp: , Rfl:    methylPREDNISolone  (MEDROL  DOSEPAK) 4 MG TBPK tablet, Take Tapered dose as directed (Patient not taking: Reported on 12/02/2024), Disp: 21 tablet, Rfl: 0   orphenadrine  (NORFLEX ) 100 MG tablet, Take 1 tablet (100 mg total) by mouth 2 (two) times daily. (Patient not taking: Reported on 05/18/2024), Disp: 20 tablet, Rfl: 1   sertraline  (ZOLOFT ) 100 MG tablet, TAKE 1 TABLET BY MOUTH EVERY DAY (Patient not taking: Reported on 12/02/2024), Disp: 90 tablet, Rfl: 1   sertraline  (ZOLOFT ) 50 MG tablet, TAKE 1 TABLET (50 MG TOTAL) BY MOUTH DAILY. TAKE WITH 100 MG TABLET TO EQUAL 150 MG DAILY. (Patient not taking: Reported on 12/02/2024), Disp: 90 tablet, Rfl: 1  "

## 2024-12-02 NOTE — Progress Notes (Signed)
 New patient; Hypogonadism in male - referred by Glendia Barba.

## 2024-12-02 NOTE — Addendum Note (Signed)
 Addended by: FAUSTINO BENCE on: 12/02/2024 02:30 PM   Modules accepted: Orders

## 2024-12-02 NOTE — Progress Notes (Signed)
 Adrian Lang presents today for phlebotomy per MD orders. Phlebotomy procedure started at 1456 and ended at 1505 . 300 mls removed. Patient tolerated procedure well. IV needle removed intact.

## 2024-12-03 LAB — ERYTHROPOIETIN: Erythropoietin: 10.1 m[IU]/mL (ref 2.6–18.5)

## 2024-12-14 ENCOUNTER — Inpatient Hospital Stay

## 2025-01-11 ENCOUNTER — Inpatient Hospital Stay

## 2025-02-08 ENCOUNTER — Inpatient Hospital Stay: Admitting: Oncology

## 2025-02-08 ENCOUNTER — Inpatient Hospital Stay

## 2025-04-20 ENCOUNTER — Other Ambulatory Visit

## 2025-05-19 ENCOUNTER — Ambulatory Visit: Admitting: Urology

## 2025-05-22 ENCOUNTER — Other Ambulatory Visit
